# Patient Record
Sex: Female | Born: 1970
Health system: Southern US, Community
[De-identification: ages and names within clinical notes are randomized; demographics above are authoritative.]

## PROBLEM LIST (undated history)

## (undated) DIAGNOSIS — F988 Other specified behavioral and emotional disorders with onset usually occurring in childhood and adolescence: Secondary | ICD-10-CM

## (undated) DIAGNOSIS — Z8041 Family history of malignant neoplasm of ovary: Secondary | ICD-10-CM

## (undated) DIAGNOSIS — Z803 Family history of malignant neoplasm of breast: Secondary | ICD-10-CM

## (undated) DIAGNOSIS — N390 Urinary tract infection, site not specified: Secondary | ICD-10-CM

## (undated) DIAGNOSIS — S43213A Anterior subluxation of unspecified sternoclavicular joint, initial encounter: Secondary | ICD-10-CM

## (undated) DIAGNOSIS — J189 Pneumonia, unspecified organism: Secondary | ICD-10-CM

## (undated) DIAGNOSIS — N921 Excessive and frequent menstruation with irregular cycle: Secondary | ICD-10-CM

## (undated) DIAGNOSIS — Z87442 Personal history of urinary calculi: Secondary | ICD-10-CM

## (undated) DIAGNOSIS — F411 Generalized anxiety disorder: Secondary | ICD-10-CM

## (undated) DIAGNOSIS — Z1371 Encounter for nonprocreative screening for genetic disease carrier status: Secondary | ICD-10-CM

## (undated) HISTORY — DX: Other specified behavioral and emotional disorders with onset usually occurring in childhood and adolescence: F98.8

## (undated) HISTORY — DX: Family history of malignant neoplasm of breast: Z80.3

## (undated) HISTORY — DX: Urinary tract infection, site not specified: N39.0

## (undated) HISTORY — DX: Encounter for nonprocreative screening for genetic disease carrier status: Z13.71

## (undated) HISTORY — PX: TONSILLECTOMY: SUR1361

## (undated) HISTORY — PX: DILATION AND CURETTAGE OF UTERUS: SHX78

## (undated) HISTORY — DX: Excessive and frequent menstruation with irregular cycle: N92.1

## (undated) HISTORY — DX: Generalized anxiety disorder: F41.1

## (undated) HISTORY — DX: Family history of malignant neoplasm of ovary: Z80.41

## (undated) HISTORY — DX: Anterior subluxation of unspecified sternoclavicular joint, initial encounter: S43.213A

## (undated) HISTORY — PX: WISDOM TOOTH EXTRACTION: SHX21

---

## 2006-01-02 ENCOUNTER — Observation Stay: Payer: Self-pay

## 2006-05-28 ENCOUNTER — Emergency Department: Payer: Self-pay | Admitting: Emergency Medicine

## 2008-01-12 ENCOUNTER — Observation Stay: Payer: Self-pay | Admitting: Obstetrics and Gynecology

## 2008-01-20 ENCOUNTER — Inpatient Hospital Stay: Payer: Self-pay

## 2008-08-24 ENCOUNTER — Ambulatory Visit: Payer: Self-pay | Admitting: Genetic Counselor

## 2010-10-29 ENCOUNTER — Ambulatory Visit: Payer: Self-pay | Admitting: Unknown Physician Specialty

## 2011-08-08 ENCOUNTER — Encounter: Payer: Self-pay | Admitting: Obstetrics and Gynecology

## 2011-08-29 ENCOUNTER — Encounter: Payer: Self-pay | Admitting: Obstetrics and Gynecology

## 2011-09-02 ENCOUNTER — Encounter: Payer: Self-pay | Admitting: Maternal & Fetal Medicine

## 2011-09-10 ENCOUNTER — Observation Stay: Payer: Self-pay | Admitting: Obstetrics & Gynecology

## 2011-09-23 ENCOUNTER — Inpatient Hospital Stay: Payer: Self-pay

## 2011-09-23 LAB — CBC WITH DIFFERENTIAL/PLATELET
Basophil #: 0 10*3/uL (ref 0.0–0.1)
Basophil %: 0.2 %
Eosinophil #: 0.1 10*3/uL (ref 0.0–0.7)
HGB: 11.6 g/dL — ABNORMAL LOW (ref 12.0–16.0)
Lymphocyte #: 1.9 10*3/uL (ref 1.0–3.6)
Lymphocyte %: 15.3 %
Monocyte #: 0.6 x10 3/mm (ref 0.2–0.9)
Monocyte %: 5 %
Neutrophil #: 10 10*3/uL — ABNORMAL HIGH (ref 1.4–6.5)
RBC: 4.52 10*6/uL (ref 3.80–5.20)
RDW: 17.9 % — ABNORMAL HIGH (ref 11.5–14.5)
WBC: 12.6 10*3/uL — ABNORMAL HIGH (ref 3.6–11.0)

## 2011-09-26 ENCOUNTER — Observation Stay: Payer: Self-pay

## 2011-10-03 ENCOUNTER — Inpatient Hospital Stay: Payer: Self-pay

## 2011-10-03 LAB — PIH PROFILE
Calcium, Total: 8.6 mg/dL (ref 8.5–10.1)
Co2: 19 mmol/L — ABNORMAL LOW (ref 21–32)
Creatinine: 0.56 mg/dL — ABNORMAL LOW (ref 0.60–1.30)
EGFR (African American): >60
Glucose: 76 mg/dL (ref 65–99)
HGB: 11 g/dL — ABNORMAL LOW (ref 12.0–16.0)
MCH: 25.6 pg — ABNORMAL LOW (ref 26.0–34.0)
MCHC: 32.3 g/dL (ref 32.0–36.0)
Platelet: 147 10*3/uL — ABNORMAL LOW (ref 150–440)
Potassium: 4.1 mmol/L (ref 3.5–5.1)
Sodium: 138 mmol/L (ref 136–145)
Uric Acid: 6.9 mg/dL — ABNORMAL HIGH (ref 2.6–6.0)

## 2011-10-03 LAB — PROTEIN / CREATININE RATIO, URINE
Protein, Random Urine: 7 mg/dL (ref 0–12)
Protein/Creat. Ratio: 83 mg/gCREAT (ref 0–200)

## 2011-10-05 LAB — HEMATOCRIT: HCT: 31 % — ABNORMAL LOW (ref 35.0–47.0)

## 2011-10-09 LAB — PATHOLOGY REPORT

## 2014-08-02 NOTE — H&P (Signed)
L&D Evaluation:  History Expanded:   HPI [age] yo [id number], [facility]  Pt presents with potential labor.    Blood Type A negative    Maternal HIV Negative    Maternal Syphilis Ab Nonreactive    Maternal Varicella Immune    Rubella Results immune    Patient's Medical History No Chronic Illness    Patient's Surgical History none    Medications Pre Natal Vitamins    Allergies NKDA    Social History none   Exam:   Vital Signs stable    General no apparent distress    Mental Status clear    Chest clear    Heart normal sinus rhythm    Abdomen gravid, non-tender    Estimated Fetal Weight Average for gestational age    Fetal Position Vtx/Vtx on US    Pelvic 2-3/75/-3    Mebranes Intact    FHT normal rate with no decels   Impression:   Impression early labor, PTL, DiDi Twins   Plan:   Comments I have had an exceeddingly long and casreful discussion on several times with this patient about the risks and benefits of each choice available to them.  I have advised her, in my humble opinion, that we should proceed with a CS for maximize the health of the babies.  They will consider and make a decision.  Pt has decided that she would like to attempt a vaginal delivery, she understands my concerns and is willing to take the risk.  She also understands taht a CS is still possible during labor or delivery.  Pt has been fully informed about all options available, the risks and benefit of each.  She understands that a CS is major surgery with all the risks of surgery including but not limited to the risks of anesthesia, hemorrhage, infection, injury to surrounding structures - bowel, bladder, blood vessels.  She is willing to accept those risks and desires to proceed with abdominal delivery.   Electronic Signatures: Towana Badgerosenow, Philip J (MD)  (Signed 01-Jul-13 20:09)  Authored: L&D Evaluation   Last Updated: 01-Jul-13 20:09 by Towana Badgerosenow, Philip J (MD)

## 2014-08-02 NOTE — H&P (Signed)
L&D Evaluation:  History Expanded:   HPI 44 yo [id number] WF with TWINS and headache today.  Ate breakfast at 0730.  Headache began at 0930.  No work today.  No h/o headaches. See in office yesterday, BP borderline, Pregnancy Induced Hypertension panel done with normal results.   Today, BP in office was 140/90s.  Since here BPs have normalized.  Denies change in vision, epigastric pain, edema. [facility].    Presents with Headache    Patient's Medical History No Chronic Illness    Patient's Surgical History none    Medications Pre Natal Vitamins    Allergies NKDA    Social History none    Family History Non-Contributory   ROS:   ROS All systems were reviewed.  HEENT, CNS, GI, GU, Respiratory, CV, Renal and Musculoskeletal systems were found to be normal.   Exam:   Vital Signs stable  109/70    Urine Protein negative dipstick    General no apparent distress    Mental Status clear    Chest clear    Heart normal sinus rhythm    Abdomen gravid, non-tender    Estimated Fetal Weight Average for gestational age    Back no CVAT    Edema no edema    Reflexes 1+    FHT normal rate with no decels, for each twin, Non-Stress Test Reactive x2    Ucx absent    Skin dry   Impression:   Impression Headache.  No preclampsia (normal labs yesterday, normal BPs with rest).   Plan:   Plan EFM/NST    Comments Fluids, Rest, Tylenol, Diet.   Electronic Signatures: Letitia LibraHarris, Nitika Jackowski Paul (MD)  (Signed 18-Jun-13 14:30)  Authored: L&D Evaluation   Last Updated: 18-Jun-13 14:30 by Letitia LibraHarris, Salmaan Patchin Paul (MD)

## 2015-08-08 LAB — HM PAP SMEAR

## 2015-10-24 DIAGNOSIS — Z1371 Encounter for nonprocreative screening for genetic disease carrier status: Secondary | ICD-10-CM

## 2015-10-24 HISTORY — DX: Encounter for nonprocreative screening for genetic disease carrier status: Z13.71

## 2016-07-05 DIAGNOSIS — M5386 Other specified dorsopathies, lumbar region: Secondary | ICD-10-CM | POA: Diagnosis not present

## 2016-07-05 DIAGNOSIS — M9908 Segmental and somatic dysfunction of rib cage: Secondary | ICD-10-CM | POA: Diagnosis not present

## 2016-07-05 DIAGNOSIS — M9902 Segmental and somatic dysfunction of thoracic region: Secondary | ICD-10-CM | POA: Diagnosis not present

## 2016-07-05 DIAGNOSIS — M531 Cervicobrachial syndrome: Secondary | ICD-10-CM | POA: Diagnosis not present

## 2017-02-11 DIAGNOSIS — M9901 Segmental and somatic dysfunction of cervical region: Secondary | ICD-10-CM | POA: Diagnosis not present

## 2017-02-11 DIAGNOSIS — M5387 Other specified dorsopathies, lumbosacral region: Secondary | ICD-10-CM | POA: Diagnosis not present

## 2017-02-11 DIAGNOSIS — M531 Cervicobrachial syndrome: Secondary | ICD-10-CM | POA: Diagnosis not present

## 2017-02-11 DIAGNOSIS — M9902 Segmental and somatic dysfunction of thoracic region: Secondary | ICD-10-CM | POA: Diagnosis not present

## 2017-06-02 ENCOUNTER — Encounter: Payer: Self-pay | Admitting: Genetics

## 2017-06-30 DIAGNOSIS — M9902 Segmental and somatic dysfunction of thoracic region: Secondary | ICD-10-CM | POA: Diagnosis not present

## 2017-06-30 DIAGNOSIS — M5386 Other specified dorsopathies, lumbar region: Secondary | ICD-10-CM | POA: Diagnosis not present

## 2017-06-30 DIAGNOSIS — M531 Cervicobrachial syndrome: Secondary | ICD-10-CM | POA: Diagnosis not present

## 2017-06-30 DIAGNOSIS — M9903 Segmental and somatic dysfunction of lumbar region: Secondary | ICD-10-CM | POA: Diagnosis not present

## 2018-02-16 DIAGNOSIS — M7611 Psoas tendinitis, right hip: Secondary | ICD-10-CM | POA: Diagnosis not present

## 2018-02-16 DIAGNOSIS — M5386 Other specified dorsopathies, lumbar region: Secondary | ICD-10-CM | POA: Diagnosis not present

## 2018-02-16 DIAGNOSIS — M9902 Segmental and somatic dysfunction of thoracic region: Secondary | ICD-10-CM | POA: Diagnosis not present

## 2018-02-16 DIAGNOSIS — M531 Cervicobrachial syndrome: Secondary | ICD-10-CM | POA: Diagnosis not present

## 2018-03-09 DIAGNOSIS — M9902 Segmental and somatic dysfunction of thoracic region: Secondary | ICD-10-CM | POA: Diagnosis not present

## 2018-03-09 DIAGNOSIS — M9901 Segmental and somatic dysfunction of cervical region: Secondary | ICD-10-CM | POA: Diagnosis not present

## 2018-03-09 DIAGNOSIS — M531 Cervicobrachial syndrome: Secondary | ICD-10-CM | POA: Diagnosis not present

## 2018-03-31 ENCOUNTER — Encounter: Payer: Self-pay | Admitting: Obstetrics and Gynecology

## 2018-03-31 NOTE — Patient Instructions (Signed)
I value your feedback and entrusting us with your care. If you get a [facility] patient survey, I would appreciate you taking the time to let us know about your experience today. Thank you! 

## 2018-03-31 NOTE — Progress Notes (Signed)
PCP:  Patient, No Pcp Per   Chief Complaint  Patient presents with  . Gynecologic Exam    longer than normal periods lasting up to 2 weeks x last 2 yrs     HPI:      Erica Haynes is a 48 y.o. No obstetric history on file. who LMP was Patient's last menstrual period was 03/04/2018 (approximate)., presents today for her annual examination.  Her menses are regular every 28-30 days, lasting 12-14 days for the past 2 yrs. Flow is heavy for 3 days, changing super tampon Q 1-2 hrs. Bleeding stops after 6 days total, has a day off, and then a gush occurs again, followed by 3-4 days of light spotting.  Dysmenorrhea moderate. Pt used to have light flow with OCPs in past.   Sex activity: single partner, contraception - vasectomy.  Last Pap: May 05, 2013  Results were: no abnormalities /neg HPV DNA  Hx of STDs: none  Last mammogram: age 72, not recent.  There is a FH of breast cancer in her mat aunt. There is a FH of ovarian cancer in her mom and mat aunt, as well as PGM. Her mom is BRCA/"update testing" neg 2017. Pt is MyRisk neg 2017. IBIS=7.3%. The patient does do self-breast exams.  Tobacco use: The patient denies current or previous tobacco use. Alcohol use: social drinker No drug use.  Exercise: not active  She does not get adequate calcium and Vitamin D in her diet.  No recent fasting labs.   Past Medical History:  Diagnosis Date  . Attention deficit disorder   . BRCA negative 10/2015   MyRisk neg; IBIS=7.3%  . Family history of breast cancer   . Family history of ovarian cancer   . Generalized anxiety disorder   . UTI (urinary tract infection)     Past Surgical History:  Procedure Laterality Date  . DILATION AND CURETTAGE OF UTERUS     SAB  . TONSILLECTOMY      Family History  Problem Relation Age of Onset  . Ovarian cancer Mother 79       BRCA/MyRisk neg  . Hypertension Father   . Breast cancer Maternal Aunt        71s  . Melanoma Maternal Aunt    58s  . Ovarian cancer Maternal Aunt        78s  . Cancer Other        27s, colon cancer  . Ovarian cancer Paternal Grandmother     Social History   Socioeconomic History  . Marital status: Single    Spouse name: Not on file  . Number of children: Not on file  . Years of education: Not on file  . Highest education level: Not on file  Occupational History  . Not on file  Social Needs  . Financial resource strain: Not on file  . Food insecurity:    Worry: Not on file    Inability: Not on file  . Transportation needs:    Medical: Not on file    Non-medical: Not on file  Tobacco Use  . Smoking status: Never Smoker  . Smokeless tobacco: Never Used  Substance and Sexual Activity  . Alcohol use: Yes    Comment: rarely  . Drug use: Never  . Sexual activity: Yes    Birth control/protection: Surgical    Comment: Vasectomy  Lifestyle  . Physical activity:    Days per week: Not on file    Minutes per  session: Not on file  . Stress: Not on file  Relationships  . Social connections:    Talks on phone: Not on file    Gets together: Not on file    Attends religious service: Not on file    Active member of club or organization: Not on file    Attends meetings of clubs or organizations: Not on file    Relationship status: Not on file  . Intimate partner violence:    Fear of current or ex partner: Not on file    Emotionally abused: Not on file    Physically abused: Not on file    Forced sexual activity: Not on file  Other Topics Concern  . Not on file  Social History Narrative  . Not on file    Outpatient Medications Prior to Visit  Medication Sig Dispense Refill  . amphetamine-dextroamphetamine (ADDERALL) 10 MG tablet Take by mouth.     No facility-administered medications prior to visit.       ROS:  Review of Systems  Constitutional: Positive for fatigue. Negative for fever and unexpected weight change.  Respiratory: Negative for cough, shortness of breath and  wheezing.   Cardiovascular: Negative for chest pain, palpitations and leg swelling.  Gastrointestinal: Negative for blood in stool, constipation, diarrhea, nausea and vomiting.  Endocrine: Negative for cold intolerance, heat intolerance and polyuria.  Genitourinary: Positive for menstrual problem. Negative for dyspareunia, dysuria, flank pain, frequency, genital sores, hematuria, pelvic pain, urgency, vaginal bleeding, vaginal discharge and vaginal pain.  Musculoskeletal: Negative for back pain, joint swelling and myalgias.  Skin: Negative for rash.  Neurological: Negative for dizziness, syncope, light-headedness, numbness and headaches.  Hematological: Negative for adenopathy.  Psychiatric/Behavioral: Positive for agitation. Negative for confusion, sleep disturbance and suicidal ideas. The patient is not nervous/anxious.    BREAST: No symptoms   Objective: BP 128/90   Pulse 83   Ht '5\' 6"'$  (1.676 m)   Wt 172 lb (78 kg)   LMP 03/04/2018 (Approximate)   BMI 27.76 kg/m    Physical Exam Constitutional:      Appearance: She is well-developed.  Genitourinary:     Vulva, vagina, cervix, uterus, right adnexa and left adnexa normal.     No vulval lesion or tenderness noted.     No vaginal discharge, erythema or tenderness.     No cervical polyp.     Uterus is not enlarged or tender.     No right or left adnexal mass present.     Right adnexa not tender.     Left adnexa not tender.  Neck:     Musculoskeletal: Normal range of motion.     Thyroid: No thyromegaly.  Cardiovascular:     Rate and Rhythm: Normal rate and regular rhythm.     Heart sounds: Normal heart sounds. No murmur.  Pulmonary:     Effort: Pulmonary effort is normal.     Breath sounds: Normal breath sounds.  Chest:     Breasts:        Right: No mass, nipple discharge, skin change or tenderness.        Left: No mass, nipple discharge, skin change or tenderness.  Abdominal:     Palpations: Abdomen is soft.      Tenderness: There is no abdominal tenderness. There is no guarding.  Musculoskeletal: Normal range of motion.  Neurological:     Mental Status: She is alert and oriented to person, place, and time.     Cranial Nerves: No  cranial nerve deficit.  Psychiatric:        Behavior: Behavior normal.  Vitals signs reviewed.     Assessment/Plan: Encounter for annual routine gynecological examination  Cervical cancer screening - Plan: Cytology - PAP  Screening for HPV (human papillomavirus) - Plan: Cytology - PAP  Screening for breast cancer - Pt to sched mammo - Plan: MM 3D SCREEN BREAST BILATERAL  Family history of ovarian cancer - Pt and her mom are MyRisk neg. Don't have great screening options.  Menorrhagia with irregular cycle - Check labs. If neg, check GYN u/s. Will call with results.  - Plan: TSH, Prolactin, CBC with Differential/Platelet  Blood tests for routine general physical examination - Will check fasting lipids at another time since not fasting today. - Plan: Comprehensive metabolic panel          GYN counsel breast self exam, mammography screening, adequate intake of calcium and vitamin D, diet and exercise     F/U  Return in about 1 year (around 04/02/2019).  Barrett Holthaus B. Johanny Segers, PA-C 04/01/2018 3:21 PM

## 2018-04-01 ENCOUNTER — Encounter: Payer: Self-pay | Admitting: Obstetrics and Gynecology

## 2018-04-01 ENCOUNTER — Other Ambulatory Visit (HOSPITAL_COMMUNITY)
Admission: RE | Admit: 2018-04-01 | Discharge: 2018-04-01 | Disposition: A | Discharge status: Home / Self Care | Payer: BLUE CROSS/BLUE SHIELD | Source: Ambulatory Visit | Attending: Obstetrics and Gynecology | Admitting: Obstetrics and Gynecology

## 2018-04-01 ENCOUNTER — Ambulatory Visit (INDEPENDENT_AMBULATORY_CARE_PROVIDER_SITE_OTHER): Payer: BLUE CROSS/BLUE SHIELD | Admitting: Obstetrics and Gynecology

## 2018-04-01 VITALS — BP 128/90 | HR 83 | Ht 66.0 in | Wt 172.0 lb

## 2018-04-01 DIAGNOSIS — Z01419 Encounter for gynecological examination (general) (routine) without abnormal findings: Secondary | ICD-10-CM

## 2018-04-01 DIAGNOSIS — Z124 Encounter for screening for malignant neoplasm of cervix: Secondary | ICD-10-CM | POA: Diagnosis not present

## 2018-04-01 DIAGNOSIS — N921 Excessive and frequent menstruation with irregular cycle: Secondary | ICD-10-CM | POA: Diagnosis not present

## 2018-04-01 DIAGNOSIS — Z1151 Encounter for screening for human papillomavirus (HPV): Secondary | ICD-10-CM | POA: Insufficient documentation

## 2018-04-01 DIAGNOSIS — Z0000 Encounter for general adult medical examination without abnormal findings: Secondary | ICD-10-CM

## 2018-04-01 DIAGNOSIS — Z8041 Family history of malignant neoplasm of ovary: Secondary | ICD-10-CM

## 2018-04-01 DIAGNOSIS — Z1239 Encounter for other screening for malignant neoplasm of breast: Secondary | ICD-10-CM

## 2018-04-02 ENCOUNTER — Telehealth: Payer: Self-pay | Admitting: Obstetrics and Gynecology

## 2018-04-02 DIAGNOSIS — N921 Excessive and frequent menstruation with irregular cycle: Secondary | ICD-10-CM

## 2018-04-02 LAB — CBC WITH DIFFERENTIAL/PLATELET
Basophils Absolute: 0.1 10*3/uL (ref 0.0–0.2)
Basos: 1 %
EOS (ABSOLUTE): 0.2 10*3/uL (ref 0.0–0.4)
Eos: 3 %
Hematocrit: 41.1 % (ref 34.0–46.6)
Hemoglobin: 13 g/dL (ref 11.1–15.9)
Immature Grans (Abs): 0 10*3/uL (ref 0.0–0.1)
Immature Granulocytes: 0 %
Lymphocytes Absolute: 1.4 10*3/uL (ref 0.7–3.1)
Lymphs: 23 %
MCH: 25.8 pg — ABNORMAL LOW (ref 26.6–33.0)
MCHC: 31.6 g/dL (ref 31.5–35.7)
MCV: 82 fL (ref 79–97)
Monocytes Absolute: 0.5 10*3/uL (ref 0.1–0.9)
Monocytes: 8 %
NEUTROS ABS: 3.9 10*3/uL (ref 1.4–7.0)
Neutrophils: 65 %
Platelets: 243 10*3/uL (ref 150–450)
RBC: 5.03 x10E6/uL (ref 3.77–5.28)
RDW: 13.3 % (ref 11.7–15.4)
WBC: 6.1 10*3/uL (ref 3.4–10.8)

## 2018-04-02 LAB — COMPREHENSIVE METABOLIC PANEL
ALT: 14 IU/L (ref 0–32)
AST: 18 IU/L (ref 0–40)
Albumin/Globulin Ratio: 2.1 (ref 1.2–2.2)
Albumin: 4.5 g/dL (ref 3.5–5.5)
Alkaline Phosphatase: 58 IU/L (ref 39–117)
BILIRUBIN TOTAL: 0.5 mg/dL (ref 0.0–1.2)
BUN/Creatinine Ratio: 19 (ref 9–23)
BUN: 15 mg/dL (ref 6–24)
CHLORIDE: 100 mmol/L (ref 96–106)
CO2: 22 mmol/L (ref 20–29)
Calcium: 9.2 mg/dL (ref 8.7–10.2)
Creatinine, Ser: 0.81 mg/dL (ref 0.57–1.00)
GFR calc Af Amer: 101 mL/min/{1.73_m2} (ref >59)
GFR calc non Af Amer: 88 mL/min/{1.73_m2} (ref >59)
Globulin, Total: 2.1 g/dL (ref 1.5–4.5)
Glucose: 73 mg/dL (ref 65–99)
Potassium: 4.5 mmol/L (ref 3.5–5.2)
Sodium: 139 mmol/L (ref 134–144)
Total Protein: 6.6 g/dL (ref 6.0–8.5)

## 2018-04-02 LAB — TSH: TSH: 1.66 u[IU]/mL (ref 0.450–4.500)

## 2018-04-02 LAB — PROLACTIN: PROLACTIN: 9.2 ng/mL (ref 4.8–23.3)

## 2018-04-02 NOTE — Telephone Encounter (Signed)
LM with normal labs. Pt needs to sched GYN u/s. Order placed. Will call with results.

## 2018-04-03 LAB — CYTOLOGY - PAP
Diagnosis: NEGATIVE
HPV: NOT DETECTED

## 2018-04-24 ENCOUNTER — Encounter: Payer: Self-pay | Admitting: Obstetrics and Gynecology

## 2018-04-24 ENCOUNTER — Ambulatory Visit
Admission: RE | Admit: 2018-04-24 | Discharge: 2018-04-24 | Disposition: A | Discharge status: Home / Self Care | Payer: BLUE CROSS/BLUE SHIELD | Source: Ambulatory Visit | Attending: Obstetrics and Gynecology | Admitting: Obstetrics and Gynecology

## 2018-04-24 ENCOUNTER — Ambulatory Visit (INDEPENDENT_AMBULATORY_CARE_PROVIDER_SITE_OTHER): Payer: BLUE CROSS/BLUE SHIELD

## 2018-04-24 DIAGNOSIS — N921 Excessive and frequent menstruation with irregular cycle: Secondary | ICD-10-CM

## 2018-04-24 DIAGNOSIS — Z1231 Encounter for screening mammogram for malignant neoplasm of breast: Secondary | ICD-10-CM | POA: Diagnosis not present

## 2018-04-24 DIAGNOSIS — N8301 Follicular cyst of right ovary: Secondary | ICD-10-CM

## 2018-04-24 DIAGNOSIS — Z1239 Encounter for other screening for malignant neoplasm of breast: Secondary | ICD-10-CM | POA: Diagnosis not present

## 2018-04-27 ENCOUNTER — Telehealth: Payer: Self-pay | Admitting: Obstetrics and Gynecology

## 2018-04-27 NOTE — Telephone Encounter (Signed)
LM with GYN results. Need to discuss next steps in DUB mgmt. Suggest EMB then hormones vs IUD vs ablation vs nothing if pt prefers.

## 2018-05-11 DIAGNOSIS — M9902 Segmental and somatic dysfunction of thoracic region: Secondary | ICD-10-CM | POA: Diagnosis not present

## 2018-05-11 DIAGNOSIS — M75102 Unspecified rotator cuff tear or rupture of left shoulder, not specified as traumatic: Secondary | ICD-10-CM | POA: Diagnosis not present

## 2018-05-11 DIAGNOSIS — M531 Cervicobrachial syndrome: Secondary | ICD-10-CM | POA: Diagnosis not present

## 2018-05-12 NOTE — Telephone Encounter (Signed)
Spoke with pt re: Gyn u/s results. Discussed OCPs/nuvaring vs ablation. Pt had IUD in past and didn't do well with it. Pros/cons discussed. Pt interested in trying low dose BC.  RTO with menses for EMB and BC conf.

## 2018-05-25 ENCOUNTER — Other Ambulatory Visit (HOSPITAL_COMMUNITY)
Admission: RE | Admit: 2018-05-25 | Discharge: 2018-05-25 | Disposition: A | Discharge status: Home / Self Care | Payer: BLUE CROSS/BLUE SHIELD | Source: Ambulatory Visit | Attending: Obstetrics and Gynecology | Admitting: Obstetrics and Gynecology

## 2018-05-25 ENCOUNTER — Encounter: Payer: Self-pay | Admitting: Obstetrics and Gynecology

## 2018-05-25 ENCOUNTER — Ambulatory Visit
Admission: RE | Admit: 2018-05-25 | Discharge: 2018-05-25 | Disposition: A | Discharge status: Home / Self Care | Payer: BLUE CROSS/BLUE SHIELD | Attending: Internal Medicine | Admitting: Internal Medicine

## 2018-05-25 ENCOUNTER — Other Ambulatory Visit: Payer: Self-pay | Admitting: Internal Medicine

## 2018-05-25 ENCOUNTER — Ambulatory Visit
Admission: RE | Admit: 2018-05-25 | Discharge: 2018-05-25 | Disposition: A | Discharge status: Home / Self Care | Payer: BLUE CROSS/BLUE SHIELD | Source: Ambulatory Visit | Attending: Internal Medicine | Admitting: Internal Medicine

## 2018-05-25 ENCOUNTER — Ambulatory Visit (INDEPENDENT_AMBULATORY_CARE_PROVIDER_SITE_OTHER): Payer: BLUE CROSS/BLUE SHIELD | Admitting: Obstetrics and Gynecology

## 2018-05-25 VITALS — BP 118/90 | HR 83 | Ht 66.0 in | Wt 176.0 lb

## 2018-05-25 DIAGNOSIS — R52 Pain, unspecified: Secondary | ICD-10-CM

## 2018-05-25 DIAGNOSIS — M898X1 Other specified disorders of bone, shoulder: Secondary | ICD-10-CM | POA: Insufficient documentation

## 2018-05-25 DIAGNOSIS — N921 Excessive and frequent menstruation with irregular cycle: Secondary | ICD-10-CM | POA: Insufficient documentation

## 2018-05-25 DIAGNOSIS — M25512 Pain in left shoulder: Secondary | ICD-10-CM | POA: Diagnosis not present

## 2018-05-25 DIAGNOSIS — S42002A Fracture of unspecified part of left clavicle, initial encounter for closed fracture: Secondary | ICD-10-CM | POA: Diagnosis not present

## 2018-05-25 DIAGNOSIS — Z30011 Encounter for initial prescription of contraceptive pills: Secondary | ICD-10-CM

## 2018-05-25 DIAGNOSIS — S43213A Anterior subluxation of unspecified sternoclavicular joint, initial encounter: Secondary | ICD-10-CM

## 2018-05-25 HISTORY — DX: Anterior subluxation of unspecified sternoclavicular joint, initial encounter: S43.213A

## 2018-05-25 MED ORDER — NORETHIN-ETH ESTRAD-FE BIPHAS 1 MG-10 MCG / 10 MCG PO TABS: 1.0000 | ORAL_TABLET | Freq: Every day | ORAL | 3 refills | Status: DC

## 2018-05-25 NOTE — Progress Notes (Signed)
Rusty Aus, MD   Chief Complaint  Patient presents with  . Endometrial Biopsy    HPI:      [name] is a 48 y.o. [id number] who LMP was Patient's last menstrual period was 05/25/2018 (exact date)., presents today for EMB for menorrhagia with irregular cycle/DUB. Pt had neg labs, GYN u/s WNL. EMB=9.3 mm. Discussed BC options for sx mgmt.  Pt interested in low dose OCPs, had good cycle control with them in past. Had IUD in past and didn't have good experience.   Past Medical History:  Diagnosis Date  . 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 Attention deficit disorder   . BRCA negative 10/2015   MyRisk neg; IBIS=7.3%  . Family history of breast cancer   . Family history of ovarian cancer   . Generalized anxiety disorder   . Menorrhagia with irregular cycle   . UTI (urinary tract infection)     Past Surgical History:  Procedure Laterality Date  . DILATION AND CURETTAGE OF UTERUS     SAB  . TONSILLECTOMY      Family History  Problem Relation Age of Onset  . Ovarian cancer Mother 26       BRCA/MyRisk neg  . Hypertension Father   . Breast cancer Maternal Aunt        76s  . Melanoma Maternal Aunt        56s  . Ovarian cancer Maternal Aunt        61s  . Cancer Other        25s, colon cancer  . Ovarian cancer Paternal Grandmother     Social History   Socioeconomic History  . Marital status: Married    Spouse name: Not on file  . Number of children: Not on file  . Years of education: Not on file  . Highest education level: Not on file  Occupational History  . Not on file  Social Needs  . Financial resource strain: Not on file  . Food insecurity:    Worry: Not on file    Inability: Not on file  . Transportation needs:    Medical: Not on file    Non-medical: Not on file  Tobacco Use  . Smoking status: Never Smoker  . Smokeless tobacco: Never Used  Substance and Sexual Activity  . Alcohol use: Yes    Comment: rarely  .  Drug use: Never  . Sexual activity: Yes    Birth control/protection: Surgical    Comment: Vasectomy  Lifestyle  . Physical activity:    Days per week: Not on file    Minutes per session: Not on file  . Stress: Not on file  Relationships  . Social connections:    Talks on phone: Not on file    Gets together: Not on file    Attends religious service: Not on file    Active member of club or organization: Not on file    Attends meetings of clubs or organizations: Not on file    Relationship status: Not on file  . Intimate partner violence:    Fear of current or ex partner: Not on file    Emotionally abused: Not on file    Physically abused: Not on file    Forced sexual activity: Not on file  Other Topics Concern  . Not on file  Social History Narrative  . Not on file    Outpatient Medications Prior to Visit  Medication Sig Dispense  Refill  . amphetamine-dextroamphetamine (ADDERALL) 10 MG tablet Take by mouth.     No facility-administered medications prior to visit.     ROS:  Review of Systems  Constitutional: Negative for fever.  Gastrointestinal: Negative for blood in stool, constipation, diarrhea, nausea and vomiting.  Genitourinary: Positive for menstrual problem. Negative for dyspareunia, dysuria, flank pain, frequency, hematuria, urgency, vaginal bleeding, vaginal discharge and vaginal pain.  Musculoskeletal: Negative for back pain.  Skin: Negative for rash.     OBJECTIVE:   Vitals:  BP 118/90   Pulse 83   Ht _0  (1.676 m)   Wt 176 lb (79.8 kg)   LMP 05/25/2018 (Exact Date)   BMI 28.41 kg/m    Physical Exam Vitals signs reviewed.  Constitutional:      Appearance: She is well-developed.  Neck:     Musculoskeletal: Normal range of motion.  Pulmonary:     Effort: Pulmonary effort is normal.  Genitourinary:    General: Normal vulva.     Vagina: Normal. No vaginal discharge.     Cervix: No friability or erythema.  Musculoskeletal: Normal range of  motion.  Neurological:     Mental Status: She is alert and oriented to person, place, and time.     Cranial Nerves: No cranial nerve deficit.  Psychiatric:        Behavior: Behavior normal.        Thought Content: Thought content normal.        Judgment: Judgment normal.    Endometrial Biopsy After discussion with the patient regarding her abnormal uterine bleeding I recommended that she proceed with an endometrial biopsy for further diagnosis. The risks, benefits, alternatives, and indications for an endometrial biopsy were discussed with the patient in detail. She understood the risks including infection, bleeding, cervical laceration and uterine perforation.  Verbal consent was obtained.   PROCEDURE NOTE:  Pipelle endometrial biopsy was performed using aseptic technique with iodine preparation.  The uterus was sounded to a length of 9.0 cm.  Adequate sampling was obtained with minimal blood loss.  The patient tolerated the procedure well.  Disposition will be pending pathology.   Assessment/Plan: Menorrhagia with irregular cycle - EMB today. Start OCPs. 1 sample Lo Loestrin/coupon card. Rx eRxd. F/u prn sx.  - Plan: Surgical pathology, Norethindrone-Ethinyl Estradiol-Fe Biphas (LO LOESTRIN FE) 1 MG-10 MCG / 10 MCG tablet  Encounter for initial prescription of contraceptive pills - Plan: Norethindrone-Ethinyl Estradiol-Fe Biphas (LO LOESTRIN FE) 1 MG-10 MCG / 10 MCG tablet    Meds ordered this encounter  Medications  . Norethindrone-Ethinyl Estradiol-Fe Biphas (LO LOESTRIN FE) 1 MG-10 MCG / 10 MCG tablet    Sig: Take 1 tablet by mouth daily.    Dispense:  84 tablet    Refill:  3    Order Specific Question:   Supervising Provider    Answer:   HARMANI, NETO [[id number]]      Return if symptoms worsen or fail to improve.   B. , PA-C 05/25/2018 4:20 PM

## 2018-05-25 NOTE — Patient Instructions (Signed)
I value your feedback and entrusting us with your care. If you get a [facility] patient survey, I would appreciate you taking the time to let us know about your experience today. Thank you! 

## 2018-08-26 ENCOUNTER — Other Ambulatory Visit: Payer: Self-pay | Admitting: Obstetrics and Gynecology

## 2018-08-26 MED ORDER — DROSPIRENONE-ETHINYL ESTRADIOL 3-0.02 MG PO TABS: 1.0000 | ORAL_TABLET | Freq: Every day | ORAL | 2 refills | Status: DC

## 2018-08-26 NOTE — Progress Notes (Signed)
OCP change from Lo Loestrin to yaz due to continued BTB with Lo Lo.

## 2018-12-04 DIAGNOSIS — M9902 Segmental and somatic dysfunction of thoracic region: Secondary | ICD-10-CM | POA: Diagnosis not present

## 2018-12-04 DIAGNOSIS — M530 Cervicocranial syndrome: Secondary | ICD-10-CM | POA: Diagnosis not present

## 2018-12-04 DIAGNOSIS — M531 Cervicobrachial syndrome: Secondary | ICD-10-CM | POA: Diagnosis not present

## 2018-12-04 DIAGNOSIS — G44219 Episodic tension-type headache, not intractable: Secondary | ICD-10-CM | POA: Diagnosis not present

## 2018-12-17 ENCOUNTER — Emergency Department
Admission: EM | Admit: 2018-12-17 | Discharge: 2018-12-17 | Disposition: A | Discharge status: Home / Self Care | Payer: 59 | Attending: Student | Admitting: Student

## 2018-12-17 ENCOUNTER — Encounter: Payer: Self-pay | Admitting: Emergency Medicine

## 2018-12-17 ENCOUNTER — Emergency Department: Payer: 59

## 2018-12-17 ENCOUNTER — Other Ambulatory Visit: Payer: Self-pay

## 2018-12-17 DIAGNOSIS — Z79899 Other long term (current) drug therapy: Secondary | ICD-10-CM | POA: Insufficient documentation

## 2018-12-17 DIAGNOSIS — R109 Unspecified abdominal pain: Secondary | ICD-10-CM | POA: Diagnosis not present

## 2018-12-17 DIAGNOSIS — R112 Nausea with vomiting, unspecified: Secondary | ICD-10-CM | POA: Diagnosis not present

## 2018-12-17 DIAGNOSIS — N200 Calculus of kidney: Secondary | ICD-10-CM | POA: Diagnosis not present

## 2018-12-17 DIAGNOSIS — R300 Dysuria: Secondary | ICD-10-CM | POA: Diagnosis not present

## 2018-12-17 DIAGNOSIS — R1110 Vomiting, unspecified: Secondary | ICD-10-CM | POA: Diagnosis not present

## 2018-12-17 LAB — URINALYSIS, COMPLETE (UACMP) WITH MICROSCOPIC
Bacteria, UA: NONE SEEN
Bilirubin Urine: NEGATIVE
Glucose, UA: NEGATIVE mg/dL
Ketones, ur: NEGATIVE mg/dL
Leukocytes,Ua: NEGATIVE
Nitrite: NEGATIVE
Protein, ur: NEGATIVE mg/dL
RBC / HPF: >50 RBC/hpf — ABNORMAL HIGH (ref 0–5)
Specific Gravity, Urine: 1.018 (ref 1.005–1.030)
pH: 5 (ref 5.0–8.0)

## 2018-12-17 LAB — COMPREHENSIVE METABOLIC PANEL
ALT: 46 U/L — ABNORMAL HIGH (ref 0–44)
AST: 36 U/L (ref 15–41)
Albumin: 4.2 g/dL (ref 3.5–5.0)
Alkaline Phosphatase: 53 U/L (ref 38–126)
Anion gap: 14 (ref 5–15)
BUN: 14 mg/dL (ref 6–20)
CO2: 22 mmol/L (ref 22–32)
Calcium: 9.2 mg/dL (ref 8.9–10.3)
Chloride: 101 mmol/L (ref 98–111)
Creatinine, Ser: 0.97 mg/dL (ref 0.44–1.00)
GFR calc Af Amer: >60 mL/min (ref >60)
GFR calc non Af Amer: >60 mL/min (ref >60)
Glucose, Bld: 131 mg/dL — ABNORMAL HIGH (ref 70–99)
Potassium: 4 mmol/L (ref 3.5–5.1)
Sodium: 137 mmol/L (ref 135–145)
Total Bilirubin: 0.6 mg/dL (ref 0.3–1.2)
Total Protein: 7.2 g/dL (ref 6.5–8.1)

## 2018-12-17 LAB — CBC
HCT: 40 % (ref 36.0–46.0)
Hemoglobin: 12.9 g/dL (ref 12.0–15.0)
MCH: 26.7 pg (ref 26.0–34.0)
MCHC: 32.3 g/dL (ref 30.0–36.0)
MCV: 82.6 fL (ref 80.0–100.0)
Platelets: 226 10*3/uL (ref 150–400)
RBC: 4.84 MIL/uL (ref 3.87–5.11)
RDW: 14.3 % (ref 11.5–15.5)
WBC: 11.3 10*3/uL — ABNORMAL HIGH (ref 4.0–10.5)
nRBC: 0 % (ref 0.0–0.2)

## 2018-12-17 LAB — POCT PREGNANCY, URINE: Preg Test, Ur: NEGATIVE

## 2018-12-17 LAB — LIPASE, BLOOD: Lipase: 36 U/L (ref 11–51)

## 2018-12-17 MED ORDER — ONDANSETRON 4 MG PO TBDP
4.0000 mg | ORAL_TABLET | Freq: Once | ORAL | Status: AC | PRN
  Administered 2018-12-17: 12:00:00 4 mg via ORAL
  Filled 2018-12-17: qty 1

## 2018-12-17 MED ORDER — KETOROLAC TROMETHAMINE 30 MG/ML IJ SOLN
15.0000 mg | Freq: Once | INTRAMUSCULAR | Status: AC
  Administered 2018-12-17: 15:00:00 15 mg via INTRAVENOUS
  Filled 2018-12-17: qty 1

## 2018-12-17 MED ORDER — ONDANSETRON HCL 4 MG PO TABS: 4.0000 mg | ORAL_TABLET | Freq: Three times a day (TID) | ORAL | 0 refills | Status: AC | PRN

## 2018-12-17 MED ORDER — SODIUM CHLORIDE 0.9 % IV BOLUS
1000.0000 mL | Freq: Once | INTRAVENOUS | Status: AC
  Administered 2018-12-17: 1000 mL via INTRAVENOUS

## 2018-12-17 MED ORDER — MORPHINE SULFATE (PF) 4 MG/ML IV SOLN
4.0000 mg | Freq: Once | INTRAVENOUS | Status: AC
  Administered 2018-12-17: 14:00:00 4 mg via INTRAVENOUS
  Filled 2018-12-17: qty 1

## 2018-12-17 MED ORDER — SODIUM CHLORIDE 0.9% FLUSH: 3.0000 mL | Freq: Once | INTRAVENOUS | Status: DC

## 2018-12-17 MED ORDER — OXYCODONE HCL 5 MG PO TABS: 5.0000 mg | ORAL_TABLET | Freq: Four times a day (QID) | ORAL | 0 refills | Status: AC | PRN

## 2018-12-17 MED ORDER — IOHEXOL 300 MG/ML  SOLN
100.0000 mL | Freq: Once | INTRAMUSCULAR | Status: AC | PRN
  Administered 2018-12-17: 14:00:00 100 mL via INTRAVENOUS

## 2018-12-17 NOTE — ED Notes (Addendum)
Pt c/o left sided abdominal pain that started several days ago, pt states it feels like she had a UTI- urine smelled also. This morning NVD. Denies fevers/ chills. Pain radiates to back on left side.  At this time pt is restless in the bed.  Pt unable to keep anything down since yesterday.

## 2018-12-17 NOTE — ED Notes (Signed)
Pt transported to CT at this time.

## 2018-12-17 NOTE — ED Triage Notes (Signed)
Pt via pov from home with abdominal pain since last night and nausea/vomiting this morning. Pt points to left flank and bladder area to show the pain. She reports that she has felt like she has had a uti and has been taking "leftover" medication. Pt alert & oriented with NAD noted.

## 2018-12-17 NOTE — Discharge Instructions (Signed)
Thank you for letting us take care of you in the emergency department today.   Please continue to take any regular, prescribed medications.   New medications we have prescribed:  - oxycodone - as needed for pain - Zofran - as needed for nausea  Please follow up with: - Your urologist at your appointment tomorrow - Your primary care doctor to review your ER visit and follow up on your symptoms.   Please return to the ER for any new or worsening symptoms.

## 2018-12-17 NOTE — ED Provider Notes (Signed)
[facility] Emergency Department Provider Note  ____________________________________________   First MD Initiated Contact with Patient 12/17/18 1319     (approximate)  I have reviewed the triage vital signs and the nursing notes.  History  Chief Complaint Abdominal Pain and Nausea    HPI Erica Haynes is a 48 y.o. female with a history of UTI, who presents to the emergency department for abdominal/flank pain, nausea, vomiting, urinary symptoms.  Patient reports dysuria and malodorous urine, which started yesterday.  Overnight she developed left flank and left lower abdominal pain, associated with nausea and vomiting.  She also had one episode of loose stool.  Pain comes in waves, and is severe when presents. Seems to originate in the flank and radiate to the lower abdomen.  Sharp.  She denies any associated fevers. She denies any history of nephrolithiasis or diverticulitis.    Past Medical Hx Past Medical History:  Diagnosis Date  . 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 Attention deficit disorder   . BRCA negative 10/2015   MyRisk neg; IBIS=7.3%  . Family history of breast cancer   . Family history of ovarian cancer   . Generalized anxiety disorder   . Menorrhagia with irregular cycle   . UTI (urinary tract infection)     Problem List There are no active problems to display for this patient.   Past Surgical Hx Past Surgical History:  Procedure Laterality Date  . DILATION AND CURETTAGE OF UTERUS     SAB  . TONSILLECTOMY      Medications Prior to Admission medications   Medication Sig Start Date End Date Taking? Authorizing Provider  amphetamine-dextroamphetamine (ADDERALL) 10 MG tablet Take by mouth. 10/18/10   [provider]  drospirenone-ethinyl estradiol (YAZ) 3-0.02 MG tablet Take 1 tablet by mouth daily. 05/28/60   Copland, Deirdre Evener, PA-C    Allergies Patient has no known allergies.  Family Hx Family  History  Problem Relation Age of Onset  . Ovarian cancer Mother 85       BRCA/MyRisk neg  . Hypertension Father   . Breast cancer Maternal Aunt        54s  . Melanoma Maternal Aunt        23s  . Ovarian cancer Maternal Aunt        45s  . Cancer Other        16s, colon cancer  . Ovarian cancer Paternal Grandmother     Social Hx Social History   Tobacco Use  . Smoking status: Never Smoker  . Smokeless tobacco: Never Used  Substance Use Topics  . Alcohol use: Yes    Comment: rarely  . Drug use: Never     Review of Systems  Constitutional: Negative for fever, chills. Eyes: Negative for visual changes. ENT: Negative for sore throat. Cardiovascular: Negative for chest pain. Respiratory: Negative for shortness of breath. Gastrointestinal: + for nausea, vomiting, abdominal pain.  Genitourinary: + for dysuria. Musculoskeletal: Negative for leg swelling. Skin: Negative for rash. Neurological: Negative for for headaches.   Physical Exam  Vital Signs: ED Triage Vitals [12/17/18 1214]  Enc Vitals Group     BP (!) 152/72     Pulse Rate 69     Resp 18     Temp 97.6 F (36.4 C)     Temp Source Oral     SpO2 100 %     Weight 170 lb (77.1 kg)     Height 5' 6" (1.676 m)  Head Circumference      Peak Flow      Pain Score 7     Pain Loc      Pain Edu?      Excl. in [place]?     Constitutional: Alert and oriented. Appears somewhat uncomfortable.  Head: Normocephalic. Atraumatic. Eyes: Conjunctivae clear. Sclera anicteric. Nose: No congestion. No rhinorrhea. Mouth/Throat: Mucous membranes are moist.  Neck: No stridor.   Cardiovascular: Normal rate, regular rhythm. No murmurs. Extremities well perfused. Respiratory: Normal respiratory effort.  Lungs CTAB. Gastrointestinal: Soft. Left flank and LLQ TTP. No rebound or guarding. Remainder of abdomen is soft and non-tender.  Musculoskeletal: No lower extremity edema. No deformities. Neurologic:  Normal speech and  language. No gross focal neurologic deficits are appreciated.  Skin: Skin is warm, dry and intact. No rash noted. Psychiatric: Mood and affect are appropriate for situation.  EKG  N/A    Radiology  CT: IMPRESSION: Moderate left hydronephrosis due to a 0.4 cm stone at the left UVJ. Punctate nonobstructing stone right kidney. Small fat containing umbilical hernia. Lower lumbar degenerative disease.      Procedures  Procedure(s) performed (including critical care):  Procedures   Initial Impression / Assessment and Plan / ED Course  48 y.o. female who presents to the ED for renal colic type left-sided flank and abdominal pain, associated with nausea/vomiting and dysuria, as above.  Ddx: UTI, pyelonephritis, stone, diverticulitis  Plan: labs, urine, imaging, symptom control  Imaging reveals a left-sided 0.4 cm UVJ stone.  Urinalysis without evidence of concomitant infection.  Patient is feeling improved.  She states she already has an appointment with a urologist tomorrow, as her husband had a prior history of kidney stones and made her an appointment when she was experiencing symptoms this morning.  As such, will plan for discharge, will provide prescriptions for pain and nausea control, and advised adherence with her urology outpatient follow-up tomorrow.  Patient voices understanding and is comfortable to plan and discharge.  Given return precautions.   Final Clinical Impression(s) / ED Diagnosis  Final diagnoses:  Dysuria  Nausea and vomiting in adult  Left sided abdominal pain       Note:  This document was prepared using Dragon voice recognition software and may include unintentional dictation errors.   Lilia Pro., MD 12/17/18 [phone]

## 2018-12-18 DIAGNOSIS — N23 Unspecified renal colic: Secondary | ICD-10-CM | POA: Diagnosis not present

## 2018-12-18 DIAGNOSIS — N201 Calculus of ureter: Secondary | ICD-10-CM | POA: Diagnosis not present

## 2019-03-31 DIAGNOSIS — M4014 Other secondary kyphosis, thoracic region: Secondary | ICD-10-CM | POA: Diagnosis not present

## 2019-03-31 DIAGNOSIS — M9902 Segmental and somatic dysfunction of thoracic region: Secondary | ICD-10-CM | POA: Diagnosis not present

## 2019-03-31 DIAGNOSIS — M9901 Segmental and somatic dysfunction of cervical region: Secondary | ICD-10-CM | POA: Diagnosis not present

## 2019-03-31 DIAGNOSIS — M531 Cervicobrachial syndrome: Secondary | ICD-10-CM | POA: Diagnosis not present

## 2019-05-02 ENCOUNTER — Other Ambulatory Visit: Payer: Self-pay | Admitting: Obstetrics and Gynecology

## 2019-05-13 ENCOUNTER — Encounter: Payer: Self-pay | Admitting: Obstetrics and Gynecology

## 2019-08-03 ENCOUNTER — Other Ambulatory Visit: Payer: Self-pay

## 2019-08-03 ENCOUNTER — Encounter: Payer: Self-pay | Admitting: Obstetrics & Gynecology

## 2019-08-03 ENCOUNTER — Ambulatory Visit (INDEPENDENT_AMBULATORY_CARE_PROVIDER_SITE_OTHER): Payer: 59 | Admitting: Obstetrics & Gynecology

## 2019-08-03 VITALS — BP 120/80 | Ht 66.0 in | Wt 179.0 lb

## 2019-08-03 DIAGNOSIS — Z8041 Family history of malignant neoplasm of ovary: Secondary | ICD-10-CM | POA: Diagnosis not present

## 2019-08-03 DIAGNOSIS — N920 Excessive and frequent menstruation with regular cycle: Secondary | ICD-10-CM | POA: Diagnosis not present

## 2019-08-03 DIAGNOSIS — N946 Dysmenorrhea, unspecified: Secondary | ICD-10-CM | POA: Diagnosis not present

## 2019-08-03 DIAGNOSIS — N921 Excessive and frequent menstruation with irregular cycle: Secondary | ICD-10-CM | POA: Insufficient documentation

## 2019-08-03 NOTE — Patient Instructions (Signed)
Endometrial Ablation  Endometrial ablation is a procedure that destroys the thin inner layer of the lining of the uterus (endometrium). This procedure may be done:  To stop heavy periods.  To stop bleeding that is causing anemia.  To control irregular bleeding.  To treat bleeding caused by small tumors (fibroids) in the endometrium.  This procedure is often an alternative to major surgery, such as removal of the uterus and cervix (hysterectomy). As a result of this procedure:  You may not be able to have children. However, if you are premenopausal (you have not gone through menopause):  You may still have a small chance of getting pregnant.  You will need to use a reliable method of birth control after the procedure to prevent pregnancy.  You may stop having a menstrual period, or you may have only a small amount of bleeding during your period. Menstruation may return several years after the procedure.  Tell a health care provider about:  Any allergies you have.  All medicines you are taking, including vitamins, herbs, eye drops, creams, and over-the-counter medicines.  Any problems you or family members have had with the use of anesthetic medicines.  Any blood disorders you have.  Any surgeries you have had.  Any medical conditions you have.  What are the risks?  Generally, this is a safe procedure. However, problems may occur, including:  A hole (perforation) in the uterus or bowel.  Infection of the uterus, bladder, or vagina.  Bleeding.  Damage to other structures or organs.  An air bubble in the lung (air embolus).  Problems with pregnancy after the procedure.  Failure of the procedure.  Decreased ability to diagnose cancer in the endometrium.  What happens before the procedure?  You will have tests of your endometrium to make sure there are no pre-cancerous cells or cancer cells present.  You may have an ultrasound of the uterus.  You may be given medicines to thin the endometrium.  Ask your health care  provider about:  Changing or stopping your regular medicines. This is especially important if you take diabetes medicines or blood thinners.  Taking medicines such as aspirin and ibuprofen. These medicines can thin your blood. Do not take these medicines before your procedure if your doctor tells you not to.  Plan to have someone take you home from the hospital or clinic.  What happens during the procedure?    You will lie on an exam table with your feet and legs supported as in a pelvic exam.  To lower your risk of infection:  Your health care team will wash or sanitize their hands and put on germ-free (sterile) gloves.  Your genital area will be washed with soap.  An IV tube will be inserted into one of your veins.  You will be given a medicine to help you relax (sedative).  A surgical instrument with a light and camera (resectoscope) will be inserted into your vagina and moved into your uterus. This allows your surgeon to see inside your uterus.  Endometrial tissue will be removed using one of the following methods:  Radiofrequency. This method uses a radiofrequency-alternating electric current to remove the endometrium.  Cryotherapy. This method uses extreme cold to freeze the endometrium.  Heated-free liquid. This method uses a heated saltwater (saline) solution to remove the endometrium.  Microwave. This method uses high-energy microwaves to heat up the endometrium and remove it.  Thermal balloon. This method involves inserting a catheter with a balloon tip into   the uterus. The balloon tip is filled with heated fluid to remove the endometrium.  The procedure may vary among health care providers and hospitals.  What happens after the procedure?  Your blood pressure, heart rate, breathing rate, and blood oxygen level will be monitored until the medicines you were given have worn off.  As tissue healing occurs, you may notice vaginal bleeding for 4-6 weeks after the procedure. You may also  experience:  Cramps.  Thin, watery vaginal discharge that is light pink or brown in color.  A need to urinate more frequently than usual.  Nausea.  Do not drive for 24 hours if you were given a sedative.  Do not have sex or insert anything into your vagina until your health care provider approves.  Summary  Endometrial ablation is done to treat the many causes of heavy menstrual bleeding.  The procedure may be done only after medications have been tried to control the bleeding.  Plan to have someone take you home from the hospital or clinic.  This information is not intended to replace advice given to you by your health care provider. Make sure you discuss any questions you have with your health care provider.  Document Revised: 08/26/2017 Document Reviewed: 03/28/2016  Elsevier Patient Education  2020 [employer]

## 2019-08-03 NOTE — Progress Notes (Signed)
HPI:      [name] is a 49 y.o. [id number] who LMP was Patient's last menstrual period was 07/13/2019., presents today for a problem visit.  She complains of menorrhagia that  began several years ago and its severity is described as severe.  She has regular periods every 28 days and they are associated with moderate menstrual cramping.  She has used the following for attempts at control: hospital pad and tampon.  Periods are 7 days heavy then another 1-2 days then lingering spotting.  FH- concern for Ovarian cancer, mom and aunt    BRCA neg herself  Previous evaluation: office visit on 2020. Prior Diagnosis: menorrhagia. EMB last year normal.  Last [place] years ago Previous Treatment: OCP, little help and very sensitive to timing of taking pills.  Prior IUD- side effects and pain.  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 Also,  has a past surgical history that includes Dilation and curettage of uterus and Tonsillectomy., family history includes Breast cancer in her maternal aunt; Cancer in an other family member; Hypertension in her father; Melanoma in her maternal aunt; Ovarian cancer in her maternal aunt and paternal grandmother; Ovarian cancer (age of onset: 65) in her mother.,  reports that she has never smoked. She has never used smokeless tobacco. She reports current alcohol use. She reports that she does not use drugs.  She  Current Outpatient Medications:  .  amphetamine-dextroamphetamine (ADDERALL) 10 MG tablet, Take by mouth., Disp: , Rfl:  .  drospirenone-ethinyl estradiol (YAZ) 3-0.02 MG tablet, Take 1 tablet by mouth daily. (Patient not taking: Reported on 08/03/2019), Disp: 3 Package, Rfl: 2  Also, has No  Known Allergies.  Review of Systems  Constitutional: Negative for chills, fever and malaise/fatigue.  HENT: Negative for congestion, sinus pain and sore throat.   Eyes: Negative for blurred vision and pain.  Respiratory: Negative for cough and wheezing.   Cardiovascular: Negative for chest pain and leg swelling.  Gastrointestinal: Negative for abdominal pain, constipation, diarrhea, heartburn, nausea and vomiting.  Genitourinary: Negative for dysuria, frequency, hematuria and urgency.  Musculoskeletal: Negative for back pain, joint pain, myalgias and neck pain.  Skin: Negative for itching and rash.  Neurological: Negative for dizziness, tremors and weakness.  Endo/Heme/Allergies: Does not bruise/bleed easily.  Psychiatric/Behavioral: Negative for depression. The patient is not nervous/anxious and does not have insomnia.     Objective: BP 120/80   Ht '5\' 6"'$  (1.676 m)   Wt 179 lb (81.2 kg)   LMP 07/13/2019   BMI 28.89 kg/m  Physical Exam Constitutional:      General: She is not in acute distress.    Appearance: She is well-developed.  Musculoskeletal:        General: Normal range of motion.  Neurological:     Mental Status: She is alert and oriented to person, place, and time.  Skin:    General: Skin is warm and dry.  Vitals reviewed.     ASSESSMENT/PLAN:  menorrhagia and painful periods  Problem List Items Addressed This Visit      Genitourinary   Dysmenorrhea     Other   Menorrhagia with regular cycle - Primary   Family history of ovarian cancer    Patient has abnormal uterine bleeding . She has a normal  exam today, with no evidence of lesions.  Evaluation includes the following: exam, labs such as hormonal testing, and pelvic ultrasound to evaluate for any structural gynecologic abnormalities.  Patient to follow up after testing.  Treatment option for menorrhagia or menometrorrhagia discussed in great detail with the patient.  Options include hormonal therapy, IUD  therapy such as Mirena, D&C, Ablation, and Hysterectomy.  The pros and cons of each option discussed with patient.  Patient was told that it is normal to have menstrual bleeding after an endometrial ablation, only about 80% of patients become amenorrheic, 10% of patients have normal or light periods, and 10% of patients have no change in their bleeding pattern and may need further intervention.  She was told she will observe her periods for a few months after her ablation to see what her periods will be like; it is recommended to wait until at least three months after the procedure before making conclusions about how periods are going to be like after an ablation.  [place] soon Consider ablation scheduling after next period  Barnett Applebaum, MD, [facility], [facility] 08/03/2019  2:55 PM

## 2019-08-11 ENCOUNTER — Other Ambulatory Visit: Payer: Self-pay | Admitting: Obstetrics & Gynecology

## 2019-08-11 ENCOUNTER — Other Ambulatory Visit: Payer: Self-pay

## 2019-08-11 ENCOUNTER — Ambulatory Visit (INDEPENDENT_AMBULATORY_CARE_PROVIDER_SITE_OTHER): Payer: 59

## 2019-08-11 DIAGNOSIS — N946 Dysmenorrhea, unspecified: Secondary | ICD-10-CM

## 2019-08-11 DIAGNOSIS — N920 Excessive and frequent menstruation with regular cycle: Secondary | ICD-10-CM

## 2019-08-11 DIAGNOSIS — Z8041 Family history of malignant neoplasm of ovary: Secondary | ICD-10-CM

## 2019-08-13 ENCOUNTER — Telehealth: Payer: Self-pay | Admitting: Obstetrics & Gynecology

## 2019-08-13 NOTE — Telephone Encounter (Signed)
Pt called to inform that she has not started cycle. I adv that she call me on the first day of next cycle in order to schedule procedure.  Pt asked about the difference between in office vs. Outpatient. I recommended she contact her insurance carrier to determine pt responsibility and deductible amounts.  She will call me on first day of next cycle.

## 2019-11-05 ENCOUNTER — Encounter: Payer: Self-pay | Admitting: Genetic Counselor

## 2019-11-23 NOTE — Telephone Encounter (Signed)
Called to see if pt is still interested in having the in office procedure. LM for pt to rtn call.

## 2020-01-18 ENCOUNTER — Other Ambulatory Visit: Payer: Self-pay

## 2020-01-18 ENCOUNTER — Ambulatory Visit (INDEPENDENT_AMBULATORY_CARE_PROVIDER_SITE_OTHER): Payer: Self-pay

## 2020-01-18 ENCOUNTER — Encounter: Payer: Self-pay | Admitting: Obstetrics and Gynecology

## 2020-01-18 DIAGNOSIS — R350 Frequency of micturition: Secondary | ICD-10-CM

## 2020-01-18 DIAGNOSIS — R3915 Urgency of urination: Secondary | ICD-10-CM

## 2020-01-18 DIAGNOSIS — R8290 Unspecified abnormal findings in urine: Secondary | ICD-10-CM

## 2020-01-18 LAB — POCT URINALYSIS DIPSTICK
Bilirubin, UA: NEGATIVE
Blood, UA: NEGATIVE
Glucose, UA: NEGATIVE
Ketones, UA: NEGATIVE
Nitrite, UA: NEGATIVE
Protein, UA: NEGATIVE
Spec Grav, UA: 1.01 (ref 1.010–1.025)
Urobilinogen, UA: NEGATIVE E.U./dL — AB
pH, UA: 6.5 (ref 5.0–8.0)

## 2020-01-18 NOTE — Telephone Encounter (Signed)
Add to nurse visit please

## 2020-01-18 NOTE — Progress Notes (Signed)
Pt came in to give a ccu specimen.  See u/a. Will send for culture. Pt aware.

## 2020-01-18 NOTE — Telephone Encounter (Signed)
Called and spoke with patient about scheduling an Ua drop nurse visit. Patient is schedule for today 01/18/20 at 10:00

## 2020-01-22 LAB — URINE CULTURE

## 2020-01-24 ENCOUNTER — Other Ambulatory Visit: Payer: Self-pay | Admitting: Obstetrics and Gynecology

## 2020-01-24 MED ORDER — NITROFURANTOIN MONOHYD MACRO 100 MG PO CAPS: 100.0000 mg | ORAL_CAPSULE | Freq: Two times a day (BID) | ORAL | 0 refills | Status: AC

## 2020-01-24 NOTE — Progress Notes (Signed)
Rx macrobid for UTI 

## 2020-01-24 NOTE — Progress Notes (Signed)
PCP:  Rusty Aus, MD   Chief Complaint  Patient presents with  . Gynecologic Exam    irreg cycles, hair loss, weight gain, mood swings     HPI:      [name] is a 49 y.o. No obstetric history on file. who LMP was Patient's last menstrual period was 01/06/2020 (exact date)., presents today for her annual examination.  Her menses are irregular now, Q1-3 months, lasting 7-14 days (for the past 3 yrs). Flow is heavy for 3 days, changing super tampon Q 1-2 hrs. Bleeding stops after 6 days total, has a day off, and then a gush occurs again, followed by 3-4 days of light spotting.  Dysmenorrhea moderate. Pt used to have light flow with OCPs in past. S/p neg EMB 2020, discussed ablation with Dr. Kenton Kingfisher 5/21, but not done since then didn't have a period for 3 months.  Pt very upset about hair thinning and coming out in clumps since 5/21. Takes MVI but doesn't have iron in it. Also waking in middle of night and not sleeping well. Has anxiety/depression sx, feels overwhelmed like something "has to give". Not exercising now because doesn't feel she has time. On lexapro in past with sx improvement. Hasn't taken for yrs. Also diagnosed with ADD in past and did adderall with some improvement. Question was if ADD contributing to stress of not getting things done.   Sex activity: single partner, contraception - vasectomy.  Last Pap: 04/01/18  Results were: no abnormalities /neg HPV DNA  Hx of STDs: none  Last mammogram: 04/24/18 Results were normal, repeat in 12 months There is a FH of breast cancer in her mat aunt. There is a FH of ovarian cancer in her mom and mat aunt, as well as PGM. Her mom is BRCA/"update testing" neg 2017. Pt is MyRisk neg 2017. IBIS=7.3%. The patient does do self-breast exams.  Tobacco use: The patient denies current or previous tobacco use. Alcohol use: social drinker No drug use.  Exercise: not active. Was exercising with wt loss and felt really good, but then  stopped.   She does get adequate calcium and Vitamin D in her diet.  Colonoscopy: never  No recent lipids, normal CMP 1/20  Treated for UTI yesterday. Picked up abx today.   Past Medical History:  Diagnosis Date  . 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 Attention deficit disorder   . BRCA negative 10/2015   MyRisk neg; IBIS=7.3%  . Family history of breast cancer   . Family history of ovarian cancer   . Generalized anxiety disorder   . Menorrhagia with irregular cycle   . UTI (urinary tract infection)     Past Surgical History:  Procedure Laterality Date  . DILATION AND CURETTAGE OF UTERUS     SAB  . TONSILLECTOMY      Family History  Problem Relation Age of Onset  . Ovarian cancer Mother 64       BRCA/MyRisk neg  . Hypertension Father   . Breast cancer Maternal Aunt        24s  . Melanoma Maternal Aunt        34s  . Ovarian cancer Maternal Aunt        18s  . Cancer Other        17s, colon cancer  . Ovarian cancer Paternal Grandmother     Social History   Socioeconomic History  . Marital status: Married    Spouse name: Not on  file  . Number of children: Not on file  . Years of education: Not on file  . Highest education level: Not on file  Occupational History  . Not on file  Tobacco Use  . Smoking status: Never Smoker  . Smokeless tobacco: Never Used  Vaping Use  . Vaping Use: Never used  Substance and Sexual Activity  . Alcohol use: Yes    Comment: rarely  . Drug use: Never  . Sexual activity: Yes    Birth control/protection: Surgical    Comment: Vasectomy  Other Topics Concern  . Not on file  Social History Narrative  . Not on file   Social Determinants of Health   Financial Resource Strain:   . Difficulty of Paying Living Expenses: Not on file  Food Insecurity:   . Worried About Charity fundraiser in the Last Year: Not on file  . Ran Out of Food in the Last Year: Not on file  Transportation Needs:   . Lack of  Transportation (Medical): Not on file  . Lack of Transportation (Non-Medical): Not on file  Physical Activity:   . Days of Exercise per Week: Not on file  . Minutes of Exercise per Session: Not on file  Stress:   . Feeling of Stress : Not on file  Social Connections:   . Frequency of Communication with Friends and Family: Not on file  . Frequency of Social Gatherings with Friends and Family: Not on file  . Attends Religious Services: Not on file  . Active Member of Clubs or Organizations: Not on file  . Attends Archivist Meetings: Not on file  . Marital Status: Not on file  Intimate Partner Violence:   . Fear of Current or Ex-Partner: Not on file  . Emotionally Abused: Not on file  . Physically Abused: Not on file  . Sexually Abused: Not on file    Outpatient Medications Prior to Visit  Medication Sig Dispense Refill  . nitrofurantoin, macrocrystal-monohydrate, (MACROBID) 100 MG capsule Take 1 capsule (100 mg total) by mouth 2 (two) times daily for 5 days. 10 capsule 0  . amphetamine-dextroamphetamine (ADDERALL) 10 MG tablet Take by mouth.    . drospirenone-ethinyl estradiol (YAZ) 3-0.02 MG tablet Take 1 tablet by mouth daily. (Patient not taking: Reported on 08/03/2019) 3 Package 2   No facility-administered medications prior to visit.      ROS:  Review of Systems  Constitutional: Negative for fatigue, fever and unexpected weight change.  Respiratory: Negative for cough, shortness of breath and wheezing.   Cardiovascular: Negative for chest pain, palpitations and leg swelling.  Gastrointestinal: Negative for blood in stool, constipation, diarrhea, nausea and vomiting.  Endocrine: Negative for cold intolerance, heat intolerance and polyuria.  Genitourinary: Positive for menstrual problem. Negative for dyspareunia, dysuria, flank pain, frequency, genital sores, hematuria, pelvic pain, urgency, vaginal bleeding, vaginal discharge and vaginal pain.  Musculoskeletal:  Positive for arthralgias. Negative for back pain, joint swelling and myalgias.  Skin: Negative for rash.  Neurological: Negative for dizziness, syncope, light-headedness, numbness and headaches.  Hematological: Negative for adenopathy.  Psychiatric/Behavioral: Positive for agitation and dysphoric mood. Negative for confusion, sleep disturbance and suicidal ideas. The patient is not nervous/anxious.     Objective: BP 140/80   Ht $R'5\' 6"'se$  (1.676 m)   Wt 181 lb (82.1 kg)   LMP 01/06/2020 (Exact Date)   BMI 29.21 kg/m    Physical Exam Constitutional:      Appearance: She is well-developed.  Genitourinary:  Vulva, vagina, cervix, uterus, right adnexa and left adnexa normal.     No vulval lesion or tenderness noted.     No vaginal discharge, erythema or tenderness.     No cervical polyp.     Uterus is not enlarged or tender.     No right or left adnexal mass present.     Right adnexa not tender.     Left adnexa not tender.  Neck:     Thyroid: No thyromegaly.  Cardiovascular:     Rate and Rhythm: Normal rate and regular rhythm.     Heart sounds: Normal heart sounds. No murmur heard.   Pulmonary:     Effort: Pulmonary effort is normal.     Breath sounds: Normal breath sounds.  Chest:     Breasts:        Right: No mass, nipple discharge, skin change or tenderness.        Left: No mass, nipple discharge, skin change or tenderness.  Abdominal:     Palpations: Abdomen is soft.     Tenderness: There is no abdominal tenderness. There is no guarding.  Musculoskeletal:        General: Normal range of motion.     Cervical back: Normal range of motion.  Neurological:     General: No focal deficit present.     Mental Status: She is alert and oriented to person, place, and time.     Cranial Nerves: No cranial nerve deficit.  Skin:    General: Skin is warm and dry.  Psychiatric:        Mood and Affect: Mood normal.        Behavior: Behavior normal.        Thought Content: Thought  content normal.        Judgment: Judgment normal.  Vitals reviewed.    GAD 7 : Generalized Anxiety Score 01/25/2020  Nervous, Anxious, on Edge 2  Control/stop worrying 3  Worry too much - different things 3  Trouble relaxing 2  Restless 1  Easily annoyed or irritable 3  Afraid - awful might happen 1  Total GAD 7 Score 15  Anxiety Difficulty Very difficult    Depression screen PHQ 2/9 01/25/2020  Decreased Interest 3  Down, Depressed, Hopeless 3  PHQ - 2 Score 6  Altered sleeping 3  Tired, decreased energy 3  Change in appetite 3  Feeling bad or failure about yourself  3  Trouble concentrating 0  Moving slowly or fidgety/restless 0  Suicidal thoughts 0  PHQ-9 Score 18  Difficult doing work/chores Extremely dIfficult     Assessment/Plan: Encounter for annual routine gynecological examination  Irregular menses--most likely perimenopausal vs stress. F/u prn AUB. If becomes monthly again, pt to consider ablation again.   Encounter for screening mammogram for malignant neoplasm of breast - Plan: MM 3D SCREEN BREAST BILATERAL; pt to sched mammo  Family history of ovarian cancer--pt and her mom are MyRisk neg. Will cont to follow for updated testing for pt.  Screening for colon cancer - Plan: Ambulatory referral to Gastroenterology; refer to GI for scr colonoscopy due to age  Hair loss - Plan: TSH + free T4, Ferritin; check labs. If neg, most likely due to increased stress. Cont MVI, add Fe.  Thyroid disorder screening - Plan: TSH + free T4  Anxiety and depression - Plan: escitalopram (LEXAPRO) 10 MG tablet; Discussed restarting SSRI and pt amenable. Rx lexapro. RTO in 7 wks for f/u/sooner prn.   Meds  ordered this encounter  Medications  . escitalopram (LEXAPRO) 10 MG tablet    Sig: Take 1/2 tab daily for 6 days then 1 tab daily    Dispense:  30 tablet    Refill:  1    Order Specific Question:   Supervising Provider    Answer:   DAVIA, SMYRE [[id number]]            GYN counsel breast self exam, mammography screening, adequate intake of calcium and vitamin D, diet and exercise     F/U  Return in about 7 weeks (around 03/14/2020) for anxiety/depression f/u.  Vivion Romano B. Spyridon Hornstein, PA-C 01/25/2020 5:24 PM

## 2020-01-25 ENCOUNTER — Encounter: Payer: Self-pay | Admitting: Obstetrics and Gynecology

## 2020-01-25 ENCOUNTER — Ambulatory Visit (INDEPENDENT_AMBULATORY_CARE_PROVIDER_SITE_OTHER): Payer: 59 | Admitting: Obstetrics and Gynecology

## 2020-01-25 ENCOUNTER — Other Ambulatory Visit: Payer: Self-pay

## 2020-01-25 VITALS — BP 140/80 | Ht 66.0 in | Wt 181.0 lb

## 2020-01-25 DIAGNOSIS — Z1231 Encounter for screening mammogram for malignant neoplasm of breast: Secondary | ICD-10-CM | POA: Diagnosis not present

## 2020-01-25 DIAGNOSIS — Z8041 Family history of malignant neoplasm of ovary: Secondary | ICD-10-CM

## 2020-01-25 DIAGNOSIS — Z1211 Encounter for screening for malignant neoplasm of colon: Secondary | ICD-10-CM

## 2020-01-25 DIAGNOSIS — F32A Depression, unspecified: Secondary | ICD-10-CM

## 2020-01-25 DIAGNOSIS — F419 Anxiety disorder, unspecified: Secondary | ICD-10-CM

## 2020-01-25 DIAGNOSIS — N926 Irregular menstruation, unspecified: Secondary | ICD-10-CM

## 2020-01-25 DIAGNOSIS — Z01419 Encounter for gynecological examination (general) (routine) without abnormal findings: Secondary | ICD-10-CM | POA: Diagnosis not present

## 2020-01-25 DIAGNOSIS — L659 Nonscarring hair loss, unspecified: Secondary | ICD-10-CM | POA: Diagnosis not present

## 2020-01-25 DIAGNOSIS — Z1329 Encounter for screening for other suspected endocrine disorder: Secondary | ICD-10-CM | POA: Diagnosis not present

## 2020-01-25 DIAGNOSIS — R69 Illness, unspecified: Secondary | ICD-10-CM | POA: Diagnosis not present

## 2020-01-25 DIAGNOSIS — Z803 Family history of malignant neoplasm of breast: Secondary | ICD-10-CM

## 2020-01-25 MED ORDER — ESCITALOPRAM OXALATE 10 MG PO TABS: ORAL_TABLET | ORAL | 1 refills | Status: DC

## 2020-01-25 NOTE — Patient Instructions (Signed)
I value your feedback and entrusting us with your care. If you get a [facility] patient survey, I would appreciate you taking the time to let us know about your experience today. Thank you!  As of March 04, 2019, your lab results will be released to your MyChart immediately, before I even have a chance to see them. Please give me time to review them and contact you if there are any abnormalities. Thank you for your patience.   [facility]: [phone]  [facility]: [phone]  

## 2020-01-26 LAB — TSH+FREE T4
Free T4: 1.24 ng/dL (ref 0.82–1.77)
TSH: 1.9 u[IU]/mL (ref 0.450–4.500)

## 2020-01-26 LAB — FERRITIN: Ferritin: 5 ng/mL — ABNORMAL LOW (ref 15–150)

## 2020-02-04 ENCOUNTER — Other Ambulatory Visit: Payer: Self-pay

## 2020-02-04 ENCOUNTER — Telehealth (INDEPENDENT_AMBULATORY_CARE_PROVIDER_SITE_OTHER): Payer: Self-pay | Admitting: Gastroenterology

## 2020-02-04 DIAGNOSIS — Z8371 Family history of colonic polyps: Secondary | ICD-10-CM

## 2020-02-04 DIAGNOSIS — F988 Other specified behavioral and emotional disorders with onset usually occurring in childhood and adolescence: Secondary | ICD-10-CM | POA: Insufficient documentation

## 2020-02-04 DIAGNOSIS — Z1211 Encounter for screening for malignant neoplasm of colon: Secondary | ICD-10-CM

## 2020-02-04 DIAGNOSIS — M5030 Other cervical disc degeneration, unspecified cervical region: Secondary | ICD-10-CM | POA: Insufficient documentation

## 2020-02-04 MED ORDER — PEG 3350-KCL-NA BICARB-NACL 420 G PO SOLR: 4000.0000 mL | Freq: Once | ORAL | 0 refills | Status: AC

## 2020-02-04 NOTE — Progress Notes (Signed)
Gastroenterology Pre-Procedure Review  Request Date: Friday 03/03/20 Requesting Physician: Dr. Tobi Bastos  PATIENT REVIEW QUESTIONS: The patient responded to the following health history questions as indicated:    1. Are you having any GI issues? no 2. Do you have a personal history of Polyps? no 3. Do you have a family history of Colon Cancer or Polyps? yes (father colon polyps, 1st Cousin Colon Cancer) 4. Diabetes Mellitus? no 5. Joint replacements in the past 12 months?no 6. Major health problems in the past 3 months?no 7. Any artificial heart valves, MVP, or defibrillator?no    MEDICATIONS & ALLERGIES:    Patient reports the following regarding taking any anticoagulation/antiplatelet therapy:   Plavix, Coumadin, Eliquis, Xarelto, Lovenox, Pradaxa, Brilinta, or Effient? no Aspirin? no  Patient confirms/reports the following medications:  Current Outpatient Medications  Medication Sig Dispense Refill   escitalopram (LEXAPRO) 10 MG tablet Take 1/2 tab daily for 6 days then 1 tab daily 30 tablet 1   No current facility-administered medications for this visit.    Patient confirms/reports the following allergies:  No Known Allergies  No orders of the defined types were placed in this encounter.   AUTHORIZATION INFORMATION Primary Insurance: 1D#: Group #:  Secondary Insurance: 1D#: Group #:  SCHEDULE INFORMATION: Date: Friday 03/03/20 Time: Location:[facility]

## 2020-02-21 ENCOUNTER — Telehealth: Payer: Self-pay | Admitting: Gastroenterology

## 2020-02-29 ENCOUNTER — Other Ambulatory Visit: Payer: Self-pay | Admitting: Obstetrics and Gynecology

## 2020-02-29 DIAGNOSIS — F32A Depression, unspecified: Secondary | ICD-10-CM

## 2020-03-01 ENCOUNTER — Other Ambulatory Visit: Payer: Self-pay | Admitting: Obstetrics and Gynecology

## 2020-03-01 DIAGNOSIS — F32A Depression, unspecified: Secondary | ICD-10-CM

## 2020-03-03 ENCOUNTER — Ambulatory Visit: Admit: 2020-03-03 | Payer: 59 | Admitting: Gastroenterology

## 2020-03-03 SURGERY — COLONOSCOPY WITH PROPOFOL
Anesthesia: General

## 2020-03-07 ENCOUNTER — Ambulatory Visit
Admission: RE | Admit: 2020-03-07 | Discharge: 2020-03-07 | Disposition: A | Discharge status: Home / Self Care | Payer: 59 | Source: Ambulatory Visit | Attending: Obstetrics and Gynecology | Admitting: Obstetrics and Gynecology

## 2020-03-07 ENCOUNTER — Other Ambulatory Visit: Payer: Self-pay

## 2020-03-07 DIAGNOSIS — Z1231 Encounter for screening mammogram for malignant neoplasm of breast: Secondary | ICD-10-CM | POA: Diagnosis not present

## 2020-03-08 NOTE — Telephone Encounter (Signed)
open encounter in error 

## 2020-03-13 ENCOUNTER — Other Ambulatory Visit: Payer: Self-pay

## 2020-03-13 ENCOUNTER — Ambulatory Visit (INDEPENDENT_AMBULATORY_CARE_PROVIDER_SITE_OTHER): Payer: 59 | Admitting: Obstetrics and Gynecology

## 2020-03-13 ENCOUNTER — Encounter: Payer: Self-pay | Admitting: Obstetrics and Gynecology

## 2020-03-13 DIAGNOSIS — F32A Depression, unspecified: Secondary | ICD-10-CM

## 2020-03-13 DIAGNOSIS — F419 Anxiety disorder, unspecified: Secondary | ICD-10-CM | POA: Diagnosis not present

## 2020-03-13 DIAGNOSIS — R69 Illness, unspecified: Secondary | ICD-10-CM | POA: Diagnosis not present

## 2020-03-13 MED ORDER — ESCITALOPRAM OXALATE 10 MG PO TABS: ORAL_TABLET | ORAL | 3 refills | Status: DC

## 2020-03-13 NOTE — Patient Instructions (Signed)
I value your feedback and entrusting us with your care. If you get a [facility] patient survey, I would appreciate you taking the time to let us know about your experience today. Thank you!  As of March 04, 2019, your lab results will be released to your MyChart immediately, before I even have a chance to see them. Please give me time to review them and contact you if there are any abnormalities. Thank you for your patience.  

## 2020-03-13 NOTE — Progress Notes (Signed)
Rusty Aus, MD   Chief Complaint  Patient presents with  . Follow-up    Anxiety/depression    HPI:      [name] is a 49 y.o. [id number] whose LMP was Patient's last menstrual period was 03/08/2020 (exact date)., presents today for anxiety/depression f/u, started lexapro 10 mg at 11/21 annual. Feels so much better. More energy/motivation, less social anxiety, sleeping through the night. Still feels overwhelmed occas but much better. No side effects, wants to cont Rx. Taking Fe supp for anemia and has felt better with that, too. 11/21 GAD=15, PHQ=18  11/21 NOTE: Pt very upset about hair thinning and coming out in clumps since 5/21. Takes MVI but doesn't have iron in it. Also waking in middle of night and not sleeping well. Has anxiety/depression sx, feels overwhelmed like something "has to give". Not exercising now because doesn't feel she has time. On lexapro in past with sx improvement. Hasn't taken for yrs. Also diagnosed with ADD in past and did adderall with some improvement. Question was if ADD contributing to stress of not getting things done.   Past Medical History:  Diagnosis Date  . 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 Attention deficit disorder   . BRCA negative 10/2015   MyRisk neg; IBIS=7.3%  . Family history of breast cancer   . Family history of ovarian cancer   . Generalized anxiety disorder   . Menorrhagia with irregular cycle   . UTI (urinary tract infection)     Past Surgical History:  Procedure Laterality Date  . DILATION AND CURETTAGE OF UTERUS     SAB  . TONSILLECTOMY      Family History  Problem Relation Age of Onset  . Ovarian cancer Mother 64       BRCA/MyRisk neg  . Hypertension Father   . Breast cancer Maternal Aunt        39s  . Melanoma Maternal Aunt        66s  . Ovarian cancer Maternal Aunt        15s  . Cancer Other        1s, colon cancer  . Ovarian cancer Paternal Grandmother     Social History    Socioeconomic History  . Marital status: Married    Spouse name: Not on file  . Number of children: Not on file  . Years of education: Not on file  . Highest education level: Not on file  Occupational History  . Not on file  Tobacco Use  . Smoking status: Never Smoker  . Smokeless tobacco: Never Used  Vaping Use  . Vaping Use: Never used  Substance and Sexual Activity  . Alcohol use: Yes    Comment: rarely  . Drug use: Never  . Sexual activity: Yes    Birth control/protection: Surgical    Comment: Vasectomy  Other Topics Concern  . Not on file  Social History Narrative  . Not on file   Social Determinants of Health   Financial Resource Strain: Not on file  Food Insecurity: Not on file  Transportation Needs: Not on file  Physical Activity: Not on file  Stress: Not on file  Social Connections: Not on file  Intimate Partner Violence: Not on file    Outpatient Medications Prior to Visit  Medication Sig Dispense Refill  . escitalopram (LEXAPRO) 10 MG tablet Take 1/2 tab daily for 6 days then 1 tab daily 30 tablet 1   No facility-administered medications  prior to visit.      ROS:  Review of Systems  Constitutional: Negative for fever, malaise/fatigue and weight loss.  Gastrointestinal: Negative for blood in stool, constipation, diarrhea, nausea and vomiting.  Genitourinary: Negative for dyspareunia, dysuria, flank pain, frequency, hematuria, urgency, vaginal bleeding, vaginal discharge and vaginal pain.  Musculoskeletal: Negative for back pain.  Skin: Negative for itching and rash.  Psychiatric/Behavioral: Negative for agitation, dysphoric mood and sleep disturbance.    OBJECTIVE:   Vitals:  BP 130/80   Ht $R'5\' 6"'fc$  (1.676 m)   Wt 186 lb (84.4 kg)   LMP 03/08/2020 (Exact Date)   BMI 30.02 kg/m   Physical Exam Vitals reviewed.  Constitutional:      Appearance: She is well-developed.  Pulmonary:     Effort: Pulmonary effort is normal.  Musculoskeletal:         General: Normal range of motion.     Cervical back: Normal range of motion.  Skin:    General: Skin is warm and dry.  Neurological:     General: No focal deficit present.     Mental Status: She is alert and oriented to person, place, and time.     Cranial Nerves: No cranial nerve deficit.  Psychiatric:        Mood and Affect: Mood normal.        Behavior: Behavior normal.        Thought Content: Thought content normal.        Judgment: Judgment normal.     Results: No results found for this or any previous visit (from the past 24 hour(s)).  GAD 7 : Generalized Anxiety Score 03/13/2020 01/25/2020  Nervous, Anxious, on Edge 1 2  Control/stop worrying 0 3  Worry too much - different things 0 3  Trouble relaxing 0 2  Restless 0 1  Easily annoyed or irritable 0 3  Afraid - awful might happen 0 1  Total GAD 7 Score 1 15  Anxiety Difficulty Not difficult at all Very difficult    Depression screen [facility] 2/9 03/13/2020  Decreased Interest 1  Down, Depressed, Hopeless 0  PHQ - 2 Score 1  Altered sleeping 0  Tired, decreased energy 2  Change in appetite 1  Feeling bad or failure about yourself  0  Trouble concentrating 1  Moving slowly or fidgety/restless 0  Suicidal thoughts 0  PHQ-9 Score 5  Difficult doing work/chores Not difficult at all    Assessment/Plan: Anxiety and depression - Plan: escitalopram (LEXAPRO) 10 MG tablet; much improved, cont lexapro. Rx RF. F/u prn.    Meds ordered this encounter  Medications  . escitalopram (LEXAPRO) 10 MG tablet    Sig: Take 1 tab daily    Dispense:  90 tablet    Refill:  3    Order Specific Question:   Supervising Provider    Answer:   EVERLEAN, BUCHER [[id number]]      Return if symptoms worsen or fail to improve.  Erica Kloeppel B. Aiysha Jillson, PA-C 03/13/2020 3:01 PM

## 2020-03-14 ENCOUNTER — Ambulatory Visit: Payer: 59 | Admitting: Obstetrics and Gynecology

## 2020-11-14 ENCOUNTER — Ambulatory Visit (INDEPENDENT_AMBULATORY_CARE_PROVIDER_SITE_OTHER): Payer: 59 | Admitting: Obstetrics and Gynecology

## 2020-11-14 ENCOUNTER — Encounter: Payer: Self-pay | Admitting: Obstetrics and Gynecology

## 2020-11-14 ENCOUNTER — Other Ambulatory Visit: Payer: Self-pay

## 2020-11-14 ENCOUNTER — Telehealth: Payer: Self-pay

## 2020-11-14 VITALS — BP 108/80 | Ht 66.0 in | Wt 188.0 lb

## 2020-11-14 DIAGNOSIS — N924 Excessive bleeding in the premenopausal period: Secondary | ICD-10-CM

## 2020-11-14 MED ORDER — NORETHINDRONE ACETATE 5 MG PO TABS: 5.0000 mg | ORAL_TABLET | Freq: Every day | ORAL | 0 refills | Status: DC

## 2020-11-14 NOTE — Progress Notes (Signed)
Erica Aus, MD   Chief Complaint  Patient presents with   Menorrhagia    Changing full pad every 45 mins-1 hr, moderate cramping    HPI:      [name] is a 50 y.o. [id number] whose LMP was Patient's last menstrual period was 11/13/2020 (exact date)., presents today for eval of menometrorrhagia since yesterday. Menses are infrequent due to perimenopause (pt unsure of LMP, but I have in notes she at least had one 10/21). Menses usually last 7-14 days with mod dysmen. Discussed ablation with Dr. Kenton Kingfisher in past but menses then became infrequent so hard to schedule and less bothersome. Started with normal amt dysmen 3 days ago and then bleeding started yesterday. Changing products Q45 min without clots. Soiled bed/clothes this AM. Not taking any meds for pain.  She is sex active, partner with vasectomy. Neg pap 1/20; Normal thyroid 11/21, low ferritin 11/21  01/25/20 NOTE: Her menses are irregular now, Q1-3 months, lasting 7-14 days (for the past 3 yrs). Flow is heavy for 3 days, changing super tampon Q 1-2 hrs. Bleeding stops after 6 days total, has a day off, and then a gush occurs again, followed by 3-4 days of light spotting.  Dysmenorrhea moderate. Pt used to have light flow with OCPs in past. S/p neg EMB 2020, discussed ablation with Dr. Kenton Kingfisher 5/21, but not done since then didn't have a period for 3 months.  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   Attention deficit disorder    BRCA negative 10/2015   MyRisk neg; IBIS=7.3%   Family history of breast cancer    Family history of ovarian cancer    Generalized anxiety disorder    Menorrhagia with irregular cycle    UTI (urinary tract infection)     Past Surgical History:  Procedure Laterality Date   DILATION AND CURETTAGE OF UTERUS     SAB   TONSILLECTOMY      Family History  Problem Relation Age of Onset   Ovarian cancer Mother 49       BRCA/MyRisk neg    Hypertension Father    Breast cancer Maternal Aunt        85s   Melanoma Maternal Aunt        49s   Ovarian cancer Maternal Aunt        83s   Cancer Other        40s, colon cancer   Ovarian cancer Paternal Grandmother     Social History   Socioeconomic History   Marital status: Married    Spouse name: Not on file   Number of children: Not on file   Years of education: Not on file   Highest education level: Not on file  Occupational History   Not on file  Tobacco Use   Smoking status: Never   Smokeless tobacco: Never  Vaping Use   Vaping Use: Never used  Substance and Sexual Activity   Alcohol use: Yes    Comment: rarely   Drug use: Never   Sexual activity: Yes    Birth control/protection: Surgical    Comment: Vasectomy  Other Topics Concern   Not on file  Social History Narrative   Not on file   Social Determinants of Health   Financial Resource Strain: Not on file  Food Insecurity: Not on file  Transportation Needs: Not on file  Physical Activity: Not on file  Stress: Not on  file  Social Connections: Not on file  Intimate Partner Violence: Not on file    Outpatient Medications Prior to Visit  Medication Sig Dispense Refill   escitalopram (LEXAPRO) 10 MG tablet Take 1 tab daily 90 tablet 3   No facility-administered medications prior to visit.    ROS:  Review of Systems  Constitutional:  Negative for fever.  Gastrointestinal:  Negative for blood in stool, constipation, diarrhea, nausea and vomiting.  Genitourinary:  Positive for menstrual problem and pelvic pain. Negative for dyspareunia, dysuria, flank pain, frequency, hematuria, urgency, vaginal bleeding, vaginal discharge and vaginal pain.  Musculoskeletal:  Negative for back pain.  Skin:  Negative for rash.  BREAST: No symptoms   OBJECTIVE:   Vitals:  BP 108/80   Ht _0  (1.676 m)   Wt 188 lb (85.3 kg)   LMP 11/13/2020 (Exact Date)   BMI 30.34 kg/m   Physical Exam Vitals reviewed.   Constitutional:      Appearance: She is well-developed.  Pulmonary:     Effort: Pulmonary effort is normal.  Genitourinary:    General: Normal vulva.     Pubic Area: No rash.      Labia:        Right: No rash, tenderness or lesion.        Left: No rash, tenderness or lesion.      Vagina: Bleeding present. No vaginal discharge, erythema or tenderness.     Cervix: Normal.     Uterus: Normal. Not enlarged and not tender.      Adnexa: Right adnexa normal and left adnexa normal.       Right: No mass or tenderness.         Left: No mass or tenderness.    Musculoskeletal:        General: Normal range of motion.     Cervical back: Normal range of motion.  Skin:    General: Skin is warm and dry.  Neurological:     General: No focal deficit present.     Mental Status: She is alert and oriented to person, place, and time.  Psychiatric:        Mood and Affect: Mood normal.        Behavior: Behavior normal.        Thought Content: Thought content normal.        Judgment: Judgment normal.    Assessment/Plan: Excessive bleeding in premenopausal period - Plan: norethindrone (AYGESTIN) 5 MG tablet; pt is perimenopausal, no AUB. Having heavy bleeding with cramping since yesterday. Normal exam today. See if flow slows today/tomorrow, try NSAIDs for pain and flow. If not, can do Rx aygestin to help with flow. Pt to f/u if starts aygestin or if sx persist/worsen; may need GYN u/s. If sx stop, f/u at 11/22 annual/sooner prn.    Meds ordered this encounter  Medications   norethindrone (AYGESTIN) 5 MG tablet    Sig: Take 1 tablet (5 mg total) by mouth daily for 10 days.    Dispense:  10 tablet    Refill:  0    Order Specific Question:   Supervising Provider    Answer:   NAYLA, DIAS [[id number]]       Return in about 3 months (around 02/14/2021), or if symptoms worsen or fail to improve, for annual.  Erica Nilsson B. Mayford Alberg, PA-C 11/14/2020 12:04 PM

## 2020-11-14 NOTE — Telephone Encounter (Signed)
Bradly Chris, RN, one of the after hour nurses called to inform us that pt called in before 8am today stating she was bleeding heavily, changing super tampon every 45 minutes; cramping, back ache, and weak.  Meriam Sprague adv pt to go to ED.  Meriam Sprague states they are required to inform the pt's doctor. Adv I would pass it on.  Called pt to see if she was seen any where.  Pt states she in currently in the office; being seen at 11:30 c [facility].

## 2020-11-21 ENCOUNTER — Other Ambulatory Visit: Payer: Self-pay | Admitting: Obstetrics and Gynecology

## 2020-11-21 DIAGNOSIS — N924 Excessive bleeding in the premenopausal period: Secondary | ICD-10-CM

## 2021-01-31 IMAGING — MG DIGITAL SCREENING BILAT W/ TOMO W/ CAD
8 series · 8 of 24 positions shown · non-contrast
Comparison: Previous exam(s).

CLINICAL DATA: Screening.

EXAM:
DIGITAL SCREENING BILATERAL MAMMOGRAM WITH TOMO AND CAD

[L MLO synth-2D]
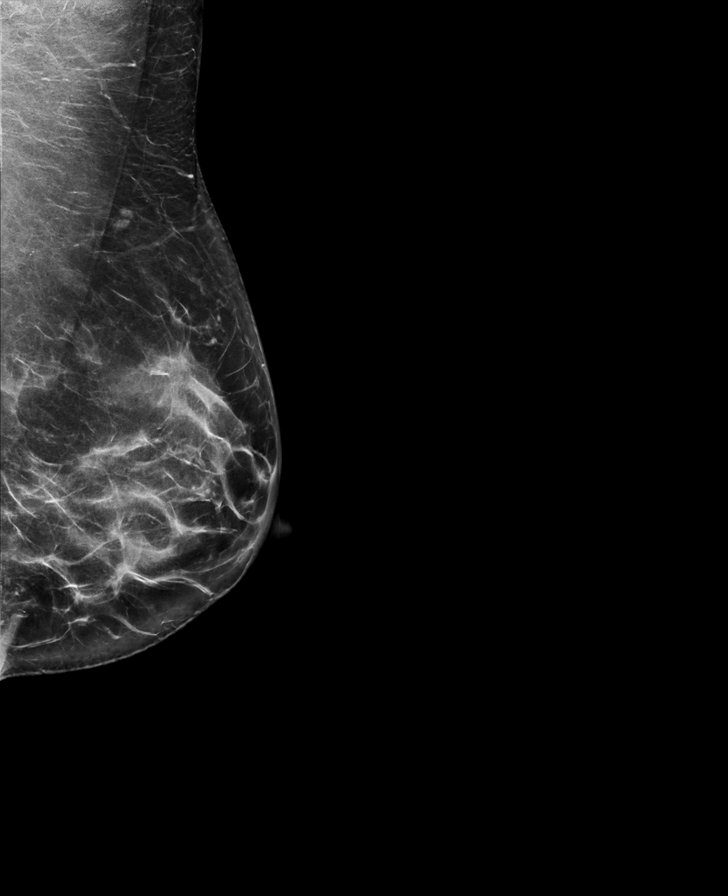

[R MLO synth-2D]
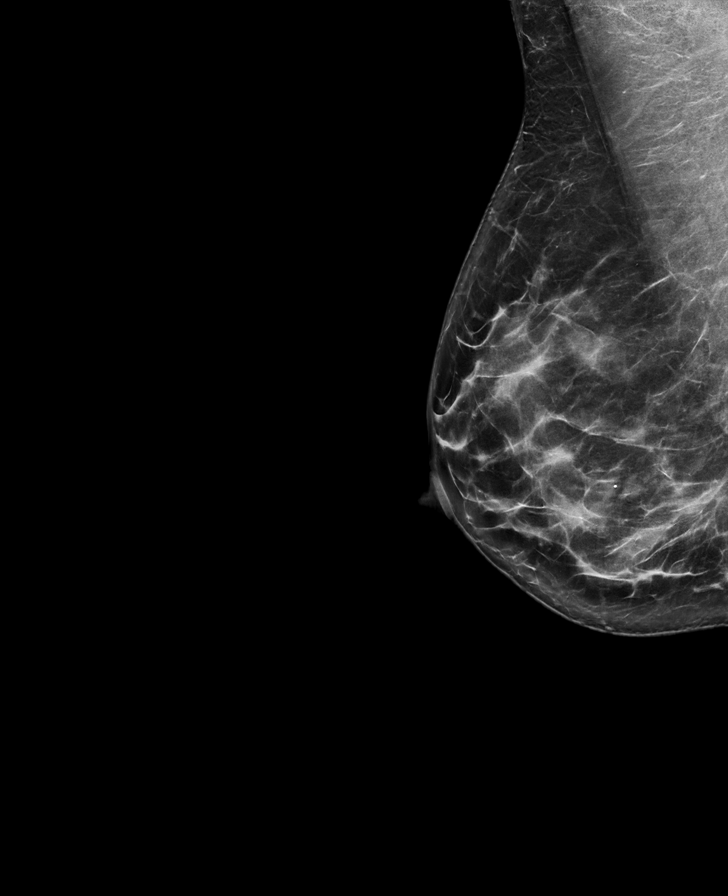

[L CC synth-2D]
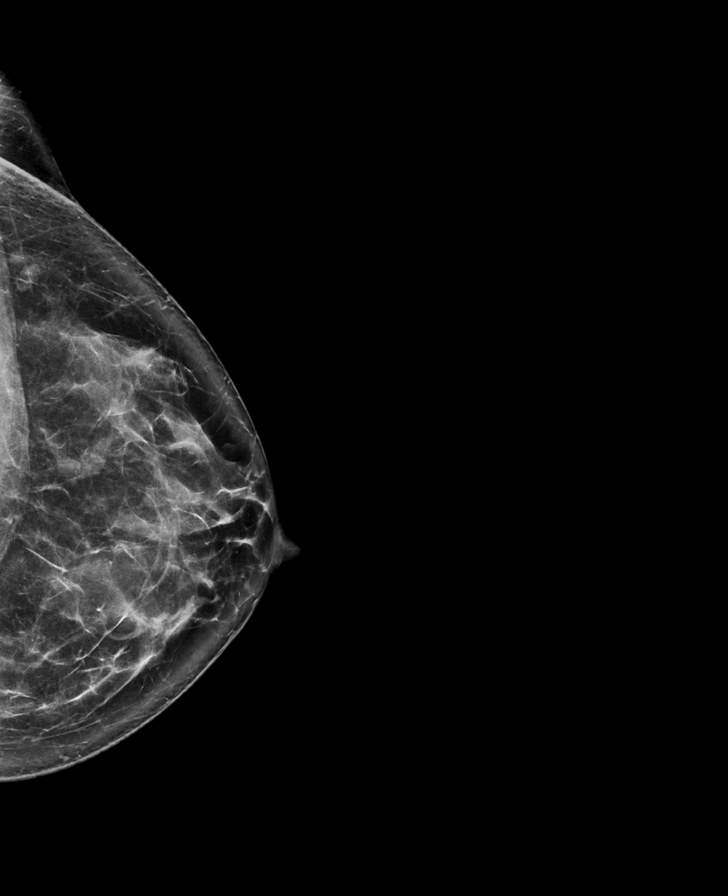

[R CC synth-2D]
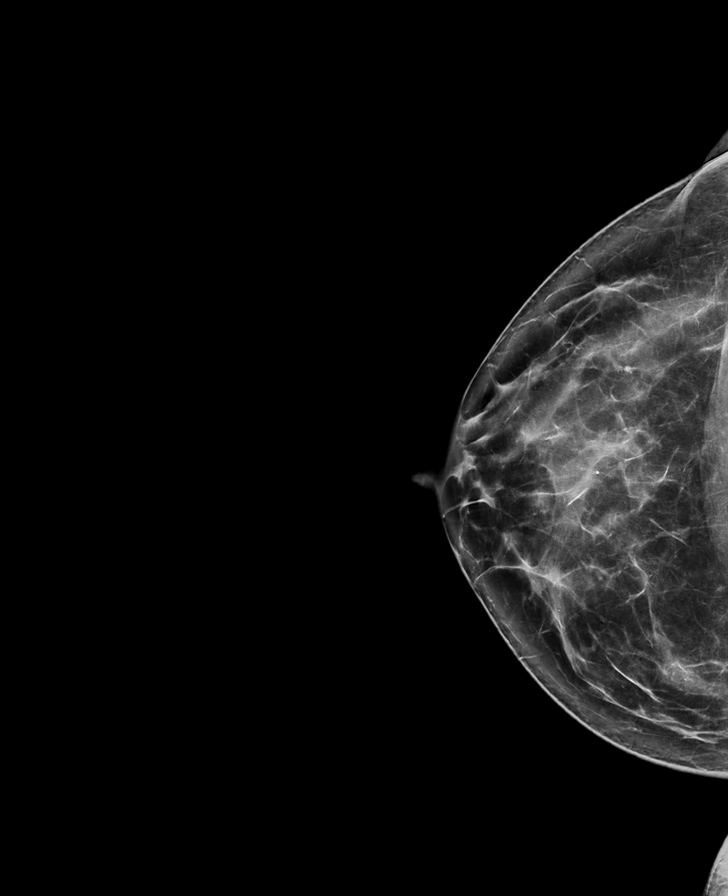

[R MLO tomo · tomo slice 39/77.0]
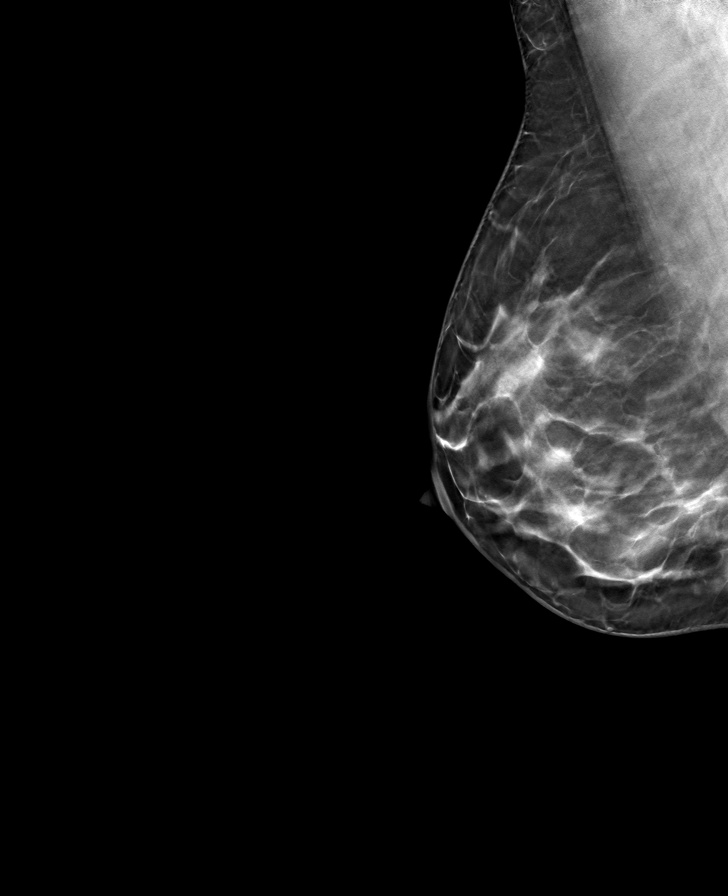

[L MLO tomo · tomo slice 40/79.0]
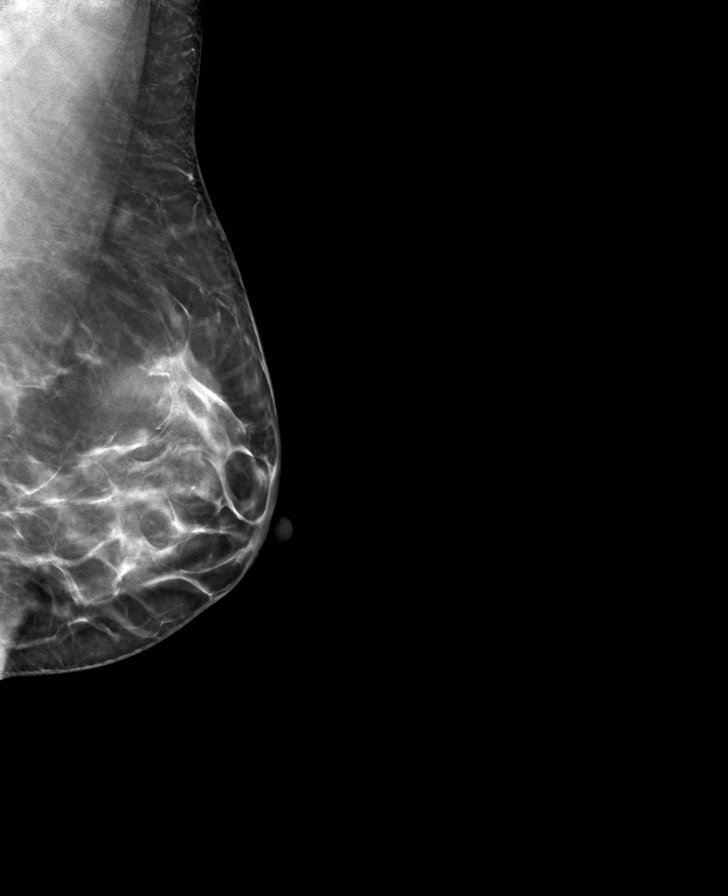

[L CC tomo · tomo slice 39/78.0]
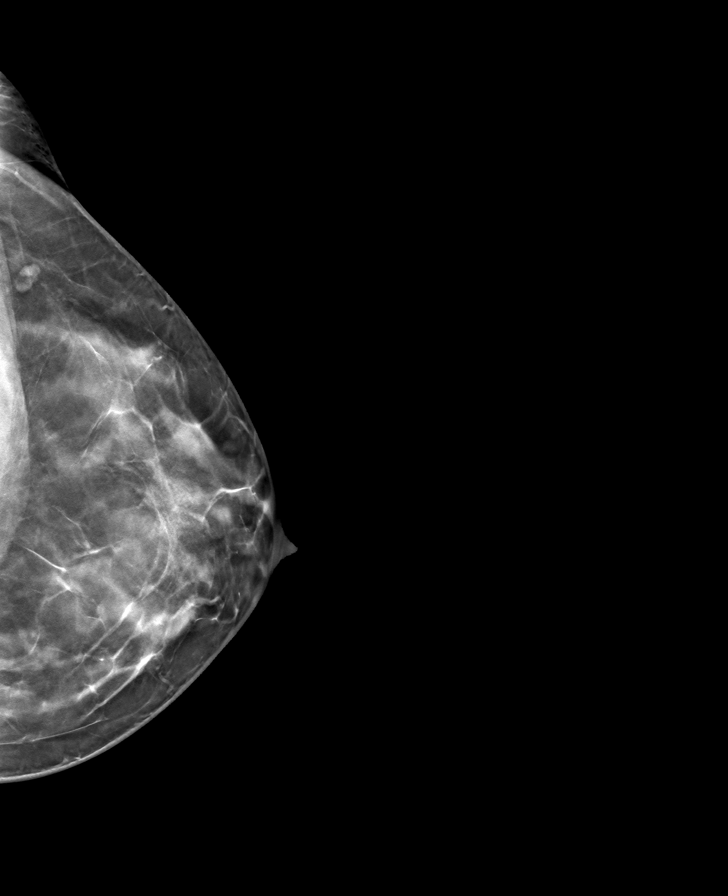

[R CC tomo · tomo slice 39/77.0]
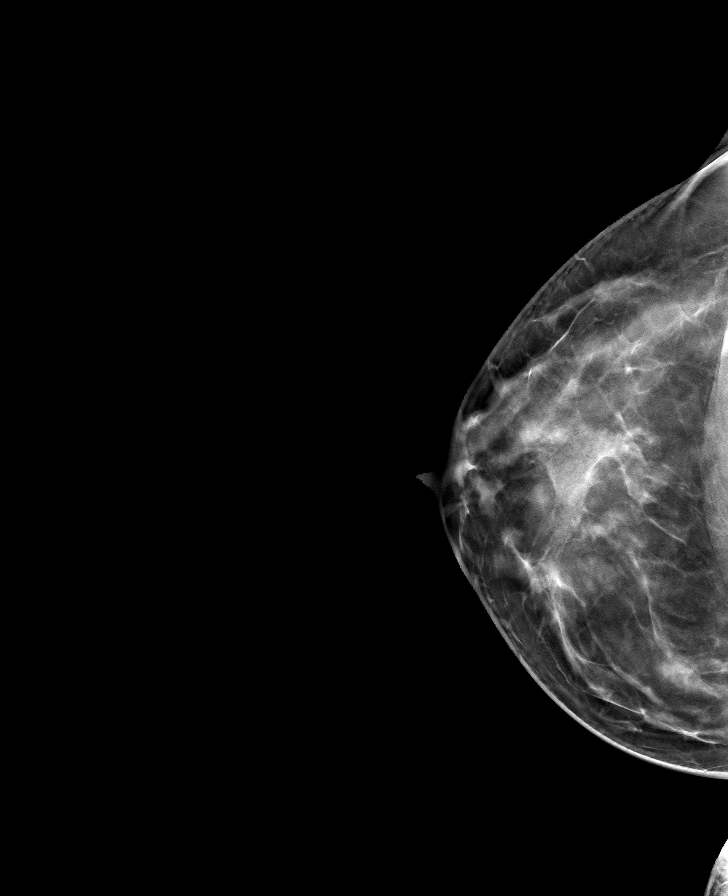

[8 of 24 positions shown; findings below may reference images not displayed]

ACR Breast Density Category c: The breast tissue is heterogeneously
dense, which may obscure small masses.
FINDINGS: There are no findings suspicious for malignancy. Images were
processed with CAD.
IMPRESSION: No mammographic evidence of malignancy. A result letter of this
screening mammogram will be mailed directly to the patient.

RECOMMENDATION:
Screening mammogram in one year. (Code:[id number]-U-LHB)

BI-RADS CATEGORY  1: Negative.

## 2021-02-28 ENCOUNTER — Other Ambulatory Visit: Payer: Self-pay | Admitting: Obstetrics and Gynecology

## 2021-02-28 DIAGNOSIS — F419 Anxiety disorder, unspecified: Secondary | ICD-10-CM

## 2021-02-28 DIAGNOSIS — F32A Depression, unspecified: Secondary | ICD-10-CM

## 2021-03-27 DIAGNOSIS — R053 Chronic cough: Secondary | ICD-10-CM | POA: Diagnosis not present

## 2021-03-27 DIAGNOSIS — J189 Pneumonia, unspecified organism: Secondary | ICD-10-CM | POA: Diagnosis not present

## 2021-03-27 DIAGNOSIS — R059 Cough, unspecified: Secondary | ICD-10-CM | POA: Diagnosis not present

## 2021-04-06 DIAGNOSIS — J189 Pneumonia, unspecified organism: Secondary | ICD-10-CM | POA: Diagnosis not present

## 2021-05-11 ENCOUNTER — Telehealth: Payer: Self-pay

## 2021-05-11 NOTE — Telephone Encounter (Signed)
Pt called our office to see if there was any opening for her irregular bleeding. Says she started bleeding on 04/16/21 and hasn't stopped since then. Is wearing tampon and pad and is having to change them every hour. Passing some clots. No abnormal pain. Advised ABC was not here today, returns Monday. Says she spoke to [facility] earlier today and was advised to pick up Rx from pharmacy to see if it helped with flow. Pt has picked it up and will it give it a try.

## 2021-05-14 ENCOUNTER — Other Ambulatory Visit: Payer: Self-pay | Admitting: Obstetrics and Gynecology

## 2021-05-14 DIAGNOSIS — N924 Excessive bleeding in the premenopausal period: Secondary | ICD-10-CM

## 2021-05-14 DIAGNOSIS — F419 Anxiety disorder, unspecified: Secondary | ICD-10-CM

## 2021-05-14 MED ORDER — NORETHINDRONE ACETATE 5 MG PO TABS: 5.0000 mg | ORAL_TABLET | Freq: Every day | ORAL | 0 refills | Status: DC

## 2021-05-14 NOTE — Progress Notes (Signed)
Pt with heavy bleeding, Rx RF aygestin. Called into [facility] [place] (not in system to eRx). [phone]

## 2021-05-18 ENCOUNTER — Other Ambulatory Visit: Payer: Self-pay | Admitting: Obstetrics and Gynecology

## 2021-05-18 DIAGNOSIS — N924 Excessive bleeding in the premenopausal period: Secondary | ICD-10-CM

## 2021-05-21 NOTE — Progress Notes (Deleted)
Miller, Mark F, MD ° ° °No chief complaint on file. ° ° °HPI: °     [name] is a 50 y.o. G6P5015 whose LMP was No LMP recorded. (Menstrual status: Irregular Periods)., presents today for *** ° ° ° °Patient Active Problem List  ° Diagnosis Date Noted  °• DDD (degenerative disc disease), cervical 02/04/2020  °• ADD (attention deficit disorder) 02/04/2020  °• Menorrhagia with regular cycle 08/03/2019  °• Family history of ovarian cancer 08/03/2019  °• Dysmenorrhea 08/03/2019  ° ° °Past Surgical History:  °Procedure Laterality Date  °• DILATION AND CURETTAGE OF UTERUS    ° SAB  °• TONSILLECTOMY    ° ° °Family History  °Problem Relation Age of Onset  °• Ovarian cancer Mother 60  °     BRCA/MyRisk neg  °• Hypertension Father   °• Breast cancer Maternal Aunt   °     60s  °• Melanoma Maternal Aunt   °     30s  °• Ovarian cancer Maternal Aunt   °     50s  °• Cancer Other   °     40s, colon cancer  °• Ovarian cancer Paternal Grandmother   ° ° °Social History  ° °Socioeconomic History  °• Marital status: Married  °  Spouse name: Not on file  °• Number of children: Not on file  °• Years of education: Not on file  °• Highest education level: Not on file  °Occupational History  °• Not on file  °Tobacco Use  °• Smoking status: Never  °• Smokeless tobacco: Never  °Vaping Use  °• Vaping Use: Never used  °Substance and Sexual Activity  °• Alcohol use: Yes  °  Comment: rarely  °• Drug use: Never  °• Sexual activity: Yes  °  Birth control/protection: Surgical  °  Comment: Vasectomy  °Other Topics Concern  °• Not on file  °Social History Narrative  °• Not on file  ° °Social Determinants of Health  ° °Financial Resource Strain: Not on file  °Food Insecurity: Not on file  °Transportation Needs: Not on file  °Physical Activity: Not on file  °Stress: Not on file  °Social Connections: Not on file  °Intimate Partner Violence: Not on file  ° ° °Outpatient Medications Prior to Visit  °Medication Sig Dispense Refill  °•  escitalopram (LEXAPRO) 10 MG tablet Take 1 tab daily 90 tablet 3  °• norethindrone (AYGESTIN) 5 MG tablet Take 1 tablet (5 mg total) by mouth daily for 10 days. 10 tablet 0  ° °No facility-administered medications prior to visit.  ° ° ° ° °ROS: ° °Review of Systems °BREAST: No symptoms ° ° °OBJECTIVE:  ° °Vitals:  °There were no vitals taken for this visit. ° °Physical Exam ° °Results: °No results found for this or any previous visit (from the past 24 hour(s)). ° ° °Assessment/Plan: °No diagnosis found. ° ° ° °No orders of the defined types were placed in this encounter. ° ° ° ° No follow-ups on file. ° °Alicia B. Copland, PA-C °05/21/2021 °4:39 PM ° ° ° ° ° °

## 2021-05-22 ENCOUNTER — Other Ambulatory Visit: Payer: Self-pay

## 2021-05-22 ENCOUNTER — Ambulatory Visit (INDEPENDENT_AMBULATORY_CARE_PROVIDER_SITE_OTHER): Payer: 59 | Admitting: Obstetrics and Gynecology

## 2021-05-22 ENCOUNTER — Ambulatory Visit: Payer: 59 | Admitting: Obstetrics and Gynecology

## 2021-05-22 ENCOUNTER — Other Ambulatory Visit (HOSPITAL_COMMUNITY)
Admission: RE | Admit: 2021-05-22 | Discharge: 2021-05-22 | Disposition: A | Discharge status: Home / Self Care | Payer: 59 | Source: Ambulatory Visit | Attending: Obstetrics and Gynecology | Admitting: Obstetrics and Gynecology

## 2021-05-22 ENCOUNTER — Encounter: Payer: Self-pay | Admitting: Obstetrics and Gynecology

## 2021-05-22 VITALS — BP 144/90 | Ht 66.0 in | Wt 194.0 lb

## 2021-05-22 DIAGNOSIS — Z1211 Encounter for screening for malignant neoplasm of colon: Secondary | ICD-10-CM

## 2021-05-22 DIAGNOSIS — Z130 Encounter for screening for diseases of the blood and blood-forming organs and certain disorders involving the immune mechanism: Secondary | ICD-10-CM | POA: Diagnosis not present

## 2021-05-22 DIAGNOSIS — Z1151 Encounter for screening for human papillomavirus (HPV): Secondary | ICD-10-CM | POA: Insufficient documentation

## 2021-05-22 DIAGNOSIS — N921 Excessive and frequent menstruation with irregular cycle: Secondary | ICD-10-CM

## 2021-05-22 DIAGNOSIS — Z124 Encounter for screening for malignant neoplasm of cervix: Secondary | ICD-10-CM | POA: Insufficient documentation

## 2021-05-22 DIAGNOSIS — Z01419 Encounter for gynecological examination (general) (routine) without abnormal findings: Secondary | ICD-10-CM

## 2021-05-22 DIAGNOSIS — D500 Iron deficiency anemia secondary to blood loss (chronic): Secondary | ICD-10-CM

## 2021-05-22 DIAGNOSIS — Z1329 Encounter for screening for other suspected endocrine disorder: Secondary | ICD-10-CM | POA: Diagnosis not present

## 2021-05-22 DIAGNOSIS — Z8041 Family history of malignant neoplasm of ovary: Secondary | ICD-10-CM

## 2021-05-22 DIAGNOSIS — N939 Abnormal uterine and vaginal bleeding, unspecified: Secondary | ICD-10-CM | POA: Insufficient documentation

## 2021-05-22 DIAGNOSIS — F32A Depression, unspecified: Secondary | ICD-10-CM

## 2021-05-22 DIAGNOSIS — F419 Anxiety disorder, unspecified: Secondary | ICD-10-CM

## 2021-05-22 DIAGNOSIS — Z1231 Encounter for screening mammogram for malignant neoplasm of breast: Secondary | ICD-10-CM | POA: Diagnosis not present

## 2021-05-22 DIAGNOSIS — R69 Illness, unspecified: Secondary | ICD-10-CM | POA: Diagnosis not present

## 2021-05-22 DIAGNOSIS — N858 Other specified noninflammatory disorders of uterus: Secondary | ICD-10-CM | POA: Diagnosis not present

## 2021-05-22 MED ORDER — ESCITALOPRAM OXALATE 10 MG PO TABS: ORAL_TABLET | ORAL | 3 refills | Status: AC

## 2021-05-22 MED ORDER — NORETHINDRONE ACETATE 5 MG PO TABS: 5.0000 mg | ORAL_TABLET | Freq: Every day | ORAL | 0 refills | Status: DC

## 2021-05-22 NOTE — Progress Notes (Signed)
PCP:  Rusty Aus, MD   Chief Complaint  Patient presents with   Gynecologic Exam    Bleeding since December, heavy at times, huge clots, normal cramping     HPI:      Erica Haynes is a 51 y.o. No obstetric history on file. who LMP was Patient's last menstrual period was 03/19/2021 (approximate)., presents today for her annual examination.  Her menses have been irregular  Q1-3 months, lasting 14 days (for the past 4-5 yrs). Flow is heavy for 3 days, changing super tampon Q 1-2 hrs, no clots. Bleeding stops after 6 days total, has a day off, and then a gush occurs again, followed by 3-4 days of light spotting.  Dysmenorrhea moderate.  Pt started bleeding 03/19/21 and has been bleeding daily since. Changing products Q30-45 min on heavy days, with large clots (largest about grapefruit size); no dysmen. Pt called me 1/23 and Rx aygestin 5 mg given (pt then went out of town). Flow has lightened to changing pad daily, but hasn't stopped and has mild dysmen now. Taking Fe supp. Pt had discussed ablation with Dr. Kenton Kingfisher in past but then went about 6 months without a period, so never had it done. S/p neg EMB 05/2018. Had normal GYN u/s 5/21 for AUB; EM=9.3 mm. Pt used to have light flow with OCPs in past but would rather not be on hormones. Had bad experience with IUD.   Has anxiety/depression sx, restarted lexapro last yr, doing better. Still doesn't have energy/drive that she used to have but does feel better. Still not exercising. Would like Rx RF. Took adderall in past,  wonders if ADD contributing to stress of not getting things done.   Sex activity: single partner, contraception - vasectomy.  Last Pap: 04/01/18  Results were: no abnormalities /neg HPV DNA  Hx of STDs: none  Last mammogram: 03/07/20 Results were normal, repeat in 12 months There is a FH of breast cancer in her mat aunt. There is a FH of ovarian cancer in her mom and mat aunt, as well as PGM. Her mom is BRCA/"update  testing" neg 2017. Pt is MyRisk neg 2017. IBIS=7.3%. The patient does do self-breast exams.  Tobacco use: The patient denies current or previous tobacco use. Alcohol use: social drinker No drug use.  Exercise: min active. Was exercising with wt loss and felt really good, but then stopped.   She does get adequate calcium and Vitamin D in her diet.  Colonoscopy: never  No recent lipids, not fasting today.  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   Attention deficit disorder    BRCA negative 10/2015   MyRisk neg; IBIS=7.3%   Family history of breast cancer    Family history of ovarian cancer    Generalized anxiety disorder    Menorrhagia with irregular cycle    UTI (urinary tract infection)     Past Surgical History:  Procedure Laterality Date   DILATION AND CURETTAGE OF UTERUS     SAB   TONSILLECTOMY      Family History  Problem Relation Age of Onset   Ovarian cancer Mother 60       BRCA/MyRisk neg   Hypertension Father    Breast cancer Maternal Aunt        60s   Melanoma Maternal Aunt        54s   Ovarian cancer Maternal Aunt        60s  Cancer Other        24s, colon cancer   Ovarian cancer Paternal Grandmother     Social History   Socioeconomic History   Marital status: Married    Spouse name: Not on file   Number of children: Not on file   Years of education: Not on file   Highest education level: Not on file  Occupational History   Not on file  Tobacco Use   Smoking status: Never   Smokeless tobacco: Never  Vaping Use   Vaping Use: Never used  Substance and Sexual Activity   Alcohol use: Yes    Comment: rarely   Drug use: Never   Sexual activity: Yes    Birth control/protection: Surgical    Comment: Vasectomy  Other Topics Concern   Not on file  Social History Narrative   Not on file   Social Determinants of Health   Financial Resource Strain: Not on file  Food Insecurity: Not  on file  Transportation Needs: Not on file  Physical Activity: Not on file  Stress: Not on file  Social Connections: Not on file  Intimate Partner Violence: Not on file    Outpatient Medications Prior to Visit  Medication Sig Dispense Refill   escitalopram (LEXAPRO) 10 MG tablet Take 1 tab daily 90 tablet 3   norethindrone (AYGESTIN) 5 MG tablet Take 1 tablet (5 mg total) by mouth daily for 10 days. 10 tablet 0   No facility-administered medications prior to visit.      ROS:  Review of Systems  Constitutional:  Positive for fatigue. Negative for fever and unexpected weight change.  Respiratory:  Negative for cough, shortness of breath and wheezing.   Cardiovascular:  Negative for chest pain, palpitations and leg swelling.  Gastrointestinal:  Negative for blood in stool, constipation, diarrhea, nausea and vomiting.  Endocrine: Negative for cold intolerance, heat intolerance and polyuria.  Genitourinary:  Positive for menstrual problem. Negative for dyspareunia, dysuria, flank pain, frequency, genital sores, hematuria, pelvic pain, urgency, vaginal bleeding, vaginal discharge and vaginal pain.  Musculoskeletal:  Negative for arthralgias, back pain, joint swelling and myalgias.  Skin:  Negative for rash.  Neurological:  Positive for dizziness. Negative for syncope, light-headedness, numbness and headaches.  Hematological:  Negative for adenopathy.  Psychiatric/Behavioral:  Positive for agitation. Negative for confusion, dysphoric mood, sleep disturbance and suicidal ideas. The patient is not nervous/anxious.    Objective: BP (!) 144/90    Ht _0  (1.676 m)    Wt 194 lb (88 kg)    LMP 03/19/2021 (Approximate)    BMI 31.31 kg/m    Physical Exam Constitutional:      Appearance: She is well-developed.  Genitourinary:     Vulva normal.     Right Labia: No rash, tenderness or lesions.    Left Labia: No tenderness, lesions or rash.    Vaginal bleeding present.     No vaginal  discharge, erythema or tenderness.      Right Adnexa: not tender and no mass present.    Left Adnexa: not tender and no mass present.    No cervical friability or polyp.     Uterus is irregular.     Uterus is not enlarged or tender.  Breasts:    Right: No mass, nipple discharge, skin change or tenderness.     Left: No mass, nipple discharge, skin change or tenderness.  Neck:     Thyroid: No thyromegaly.  Cardiovascular:     Rate and  Rhythm: Normal rate and regular rhythm.     Heart sounds: Normal heart sounds. No murmur heard. Pulmonary:     Effort: Pulmonary effort is normal.     Breath sounds: Normal breath sounds.  Abdominal:     Palpations: Abdomen is soft.     Tenderness: There is no abdominal tenderness. There is no guarding or rebound.  Musculoskeletal:        General: Normal range of motion.     Cervical back: Normal range of motion.  Lymphadenopathy:     Cervical: No cervical adenopathy.  Neurological:     General: No focal deficit present.     Mental Status: She is alert and oriented to person, place, and time.     Cranial Nerves: No cranial nerve deficit.  Skin:    General: Skin is warm and dry.  Psychiatric:        Mood and Affect: Mood normal.        Behavior: Behavior normal.        Thought Content: Thought content normal.        Judgment: Judgment normal.  Vitals reviewed.    Endometrial Biopsy After discussion with the patient regarding her abnormal uterine bleeding I recommended that she proceed with an endometrial biopsy for further diagnosis. The risks, benefits, alternatives, and indications for an endometrial biopsy were discussed with the patient in detail.  Verbal consent was obtained.   PROCEDURE NOTE:  Pipelle endometrial biopsy was performed using aseptic technique with iodine preparation.  The uterus was sounded to a length of 9.0 cm.  Adequate sampling was obtained with minimal blood loss.  The patient tolerated the procedure well.  Disposition  will be pending pathology.   Assessment/Plan: Encounter for annual routine gynecological examination  Cervical cancer screening - Plan: Cytology - PAP  Screening for HPV (human papillomavirus) - Plan: Cytology - PAP  Encounter for screening mammogram for malignant neoplasm of breast - Plan: MM 3D SCREEN BREAST BILATERAL; pt to schedule mammo  Family history of ovarian cancer--pt is MyRisk negative.   Screening for colon cancer - Plan: Cologuard; colonoscopy/cologuard discussed. Pt elects cologuard. Ref sent. Will f/u with results.  Menorrhagia with irregular cycle - Plan: norethindrone (AYGESTIN) 5 MG tablet, US PELVIS TRANSVAGINAL NON-OB (TV ONLY);   Abnormal uterine bleeding (AUB) - Plan: CBC with Differential/Platelet, TSH + free T4, Surgical pathology, norethindrone (AYGESTIN) 5 MG tablet, US PELVIS TRANSVAGINAL NON-OB (TV ONLY); sx since 12/22; check labs, pap, EMB, Gyn u/s. Will f/u with results. Pt would like ablation. RTO with MD after GYN u/s for ablation conf. Cont Fe supp  Thyroid disorder screening - Plan: TSH + free T4  Screening for deficiency anemia - Plan: CBC with Differential/Platelet  Anxiety and depression - Plan: escitalopram (LEXAPRO) 10 MG tablet; Rx RF.    Meds ordered this encounter  Medications   norethindrone (AYGESTIN) 5 MG tablet    Sig: Take 1 tablet (5 mg total) by mouth daily for 14 days.    Dispense:  14 tablet    Refill:  0    Order Specific Question:   Supervising Provider    Answer:   NISSA, STANNARD [[id number]]   escitalopram (LEXAPRO) 10 MG tablet    Sig: Take 1 tab daily    Dispense:  90 tablet    Refill:  3    Order Specific Question:   Supervising Provider    Answer:   MAKILAH, DOWDA [[id number]]  GYN counsel breast self exam, mammography screening, adequate intake of calcium and vitamin D, diet and exercise     F/U  Return in about 16 days (around 06/07/2021) for GYN u/s for AUB/MD appt afterwards for endometrial ablation  consult.  Quoc Tome B. Talley Kreiser, PA-C 05/22/2021 11:24 AM

## 2021-05-22 NOTE — Patient Instructions (Signed)
I value your feedback and you entrusting us with your care. If you get a [facility] patient survey, I would appreciate you taking the time to let us know about your experience today. Thank you!  [facility]: [phone]      

## 2021-05-23 LAB — CBC WITH DIFFERENTIAL/PLATELET
Basophils Absolute: 0.1 10*3/uL (ref 0.0–0.2)
Basos: 1 %
EOS (ABSOLUTE): 0.2 10*3/uL (ref 0.0–0.4)
Eos: 3 %
Hematocrit: 28.4 % — ABNORMAL LOW (ref 34.0–46.6)
Hemoglobin: 8.9 g/dL — ABNORMAL LOW (ref 11.1–15.9)
Immature Grans (Abs): 0 10*3/uL (ref 0.0–0.1)
Immature Granulocytes: 0 %
Lymphocytes Absolute: 1.5 10*3/uL (ref 0.7–3.1)
Lymphs: 22 %
MCH: 28.7 pg (ref 26.6–33.0)
MCHC: 31.3 g/dL — ABNORMAL LOW (ref 31.5–35.7)
MCV: 92 fL (ref 79–97)
Monocytes Absolute: 0.4 10*3/uL (ref 0.1–0.9)
Monocytes: 5 %
Neutrophils Absolute: 4.6 10*3/uL (ref 1.4–7.0)
Neutrophils: 69 %
Platelets: 297 10*3/uL (ref 150–450)
RBC: 3.1 x10E6/uL — ABNORMAL LOW (ref 3.77–5.28)
RDW: 14.1 % (ref 11.7–15.4)
WBC: 6.7 10*3/uL (ref 3.4–10.8)

## 2021-05-23 LAB — TSH+FREE T4
Free T4: 1.11 ng/dL (ref 0.82–1.77)
TSH: 1.15 u[IU]/mL (ref 0.450–4.500)

## 2021-05-23 NOTE — Addendum Note (Signed)
Addended by: Althea Grimmer B on: 05/23/2021 12:00 PM   Modules accepted: Orders

## 2021-05-24 LAB — CYTOLOGY - PAP
Comment: NEGATIVE
Diagnosis: NEGATIVE
High risk HPV: NEGATIVE

## 2021-05-24 LAB — SURGICAL PATHOLOGY

## 2021-05-31 ENCOUNTER — Other Ambulatory Visit: Payer: Self-pay

## 2021-05-31 ENCOUNTER — Other Ambulatory Visit: Payer: 59

## 2021-05-31 DIAGNOSIS — D500 Iron deficiency anemia secondary to blood loss (chronic): Secondary | ICD-10-CM | POA: Diagnosis not present

## 2021-06-01 ENCOUNTER — Inpatient Hospital Stay: Payer: 59 | Attending: Oncology | Admitting: Oncology

## 2021-06-01 ENCOUNTER — Inpatient Hospital Stay: Payer: 59

## 2021-06-01 ENCOUNTER — Encounter: Payer: Self-pay | Admitting: Oncology

## 2021-06-01 VITALS — BP 147/69 | HR 87 | Temp 98.9°F | Ht 66.0 in | Wt 193.0 lb

## 2021-06-01 DIAGNOSIS — D509 Iron deficiency anemia, unspecified: Secondary | ICD-10-CM

## 2021-06-01 DIAGNOSIS — Z8041 Family history of malignant neoplasm of ovary: Secondary | ICD-10-CM | POA: Insufficient documentation

## 2021-06-01 DIAGNOSIS — D500 Iron deficiency anemia secondary to blood loss (chronic): Secondary | ICD-10-CM | POA: Insufficient documentation

## 2021-06-01 DIAGNOSIS — Z800 Family history of malignant neoplasm of digestive organs: Secondary | ICD-10-CM | POA: Insufficient documentation

## 2021-06-01 DIAGNOSIS — Z803 Family history of malignant neoplasm of breast: Secondary | ICD-10-CM | POA: Insufficient documentation

## 2021-06-01 DIAGNOSIS — N920 Excessive and frequent menstruation with regular cycle: Secondary | ICD-10-CM | POA: Insufficient documentation

## 2021-06-01 DIAGNOSIS — Z808 Family history of malignant neoplasm of other organs or systems: Secondary | ICD-10-CM

## 2021-06-01 HISTORY — DX: Iron deficiency anemia, unspecified: D50.9

## 2021-06-01 LAB — IRON,TIBC AND FERRITIN PANEL
Ferritin: 11 ng/mL — ABNORMAL LOW (ref 15–150)
Iron Saturation: 4 % — CL (ref 15–55)
Iron: 16 ug/dL — ABNORMAL LOW (ref 27–159)
Total Iron Binding Capacity: 369 ug/dL (ref 250–450)
UIBC: 353 ug/dL (ref 131–425)

## 2021-06-01 NOTE — Progress Notes (Signed)
?Hematology/Oncology Consult note ?[phone] [phone] ?  ? ?   ? ? ?Patient Care Team: ?Rusty Aus, MD as PCP - General (Internal Medicine) ? ?REFERRING PROVIDER: ?Copland, Alicia B, PA-C  ?CHIEF COMPLAINTS/REASON FOR VISIT:  ?Evaluation of iron deficiency anemia.  ? ?HISTORY OF PRESENTING ILLNESS:  ? ?Erica Haynes is a  51 y.o.  female with PMH listed below was seen in consultation at the request of  Copland, Elmo Putt B, PA-C  for evaluation of iron deficiency anemia.  ? ?She has heavy menstrual period. + fatigue, SOB, decreased energy level. She has had some GI issue, with diarrhea. Denies melena, blood in the stool. ?She had blood work done on 2/28/203. Cbc showed hemoglobin 8.9 mcv 92. Iron panel showed iron saturation 4, ferritin 11 ? ?05/22/2021 Endometrium biopsy showed Benign mixed pattern endometrium with marked progestational effect Stromal breakdown consistent with bleeding Negative for polyp, hyperplasia and carcinoma. Cytology showed - Negative for intraepithelial lesion or malignancy (NILM) ? ?Reviewed her previous records, anemia onset is new. Iron deficiency showed decreased ferritin of 5 on 01/25/20. ?She has taken oral iron supplementation -Optiferrin ? ?Review of Systems  ?Constitutional:  Positive for fatigue. Negative for appetite change, chills and fever.  ?HENT:   Negative for hearing loss and voice change.   ?Eyes:  Negative for eye problems.  ?Respiratory:  Positive for shortness of breath. Negative for chest tightness and cough.   ?Cardiovascular:  Negative for chest pain.  ?Gastrointestinal:  Negative for abdominal distention, abdominal pain and blood in stool.  ?Endocrine: Negative for hot flashes.  ?Genitourinary:  Positive for menstrual problem. Negative for difficulty urinating and frequency.   ?Musculoskeletal:  Negative for arthralgias.  ?Skin:  Negative for itching and rash.  ?Neurological:  Negative for extremity weakness.  ?Hematological:  Negative for  adenopathy.  ?Psychiatric/Behavioral:  Negative for confusion.   ? ?MEDICAL HISTORY:  ?Past Medical History:  ?Diagnosis Date  ? 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 Attention deficit disorder   ? BRCA negative 10/2015  ? MyRisk neg; IBIS=7.3%  ? Family history of breast cancer   ? Family history of ovarian cancer   ? Generalized anxiety disorder   ? IDA (iron deficiency anemia) 06/01/2021  ? Menorrhagia with irregular cycle   ? UTI (urinary tract infection)   ? ? ?SURGICAL HISTORY: ?Past Surgical History:  ?Procedure Laterality Date  ? DILATION AND CURETTAGE OF UTERUS    ? SAB  ? TONSILLECTOMY    ? ? ?SOCIAL HISTORY: ?Social History  ? ?Socioeconomic History  ? Marital status: Married  ?  Spouse name: Not on file  ? Number of children: Not on file  ? Years of education: Not on file  ? Highest education level: Not on file  ?Occupational History  ? Not on file  ?Tobacco Use  ? Smoking status: Never  ? Smokeless tobacco: Never  ?Vaping Use  ? Vaping Use: Never used  ?Substance and Sexual Activity  ? Alcohol use: Yes  ?  Comment: rarely  ? Drug use: Never  ? Sexual activity: Yes  ?  Birth control/protection: Surgical  ?  Comment: Vasectomy  ?Other Topics Concern  ? Not on file  ?Social History Narrative  ? Not on file  ? ?Social Determinants of Health  ? ?Financial Resource Strain: Not on file  ?Food Insecurity: Not on file  ?Transportation Needs: Not on file  ?Physical Activity: Not on file  ?Stress: Not on file  ?Social Connections: Not  on file  ?Intimate Partner Violence: Not on file  ? ? ?FAMILY HISTORY: ?Family History  ?Problem Relation Age of Onset  ? Ovarian cancer Mother 56  ?     BRCA/MyRisk neg  ? Hypertension Father   ? Breast cancer Maternal Aunt   ?     39s  ? Melanoma Maternal Aunt   ?     32s  ? Ovarian cancer Maternal Aunt   ?     29s  ? Cancer Other   ?     98s, colon cancer  ? Ovarian cancer Paternal Grandmother   ? ? ?ALLERGIES:  has No Known Allergies. ? ?MEDICATIONS:   ?Current Outpatient Medications  ?Medication Sig Dispense Refill  ? escitalopram (LEXAPRO) 10 MG tablet Take 1 tab daily 90 tablet 3  ? norethindrone (AYGESTIN) 5 MG tablet Take 1 tablet (5 mg total) by mouth daily for 14 days. (Patient not taking: Reported on 06/01/2021) 14 tablet 0  ? ?No current facility-administered medications for this visit.  ? ? ? ?PHYSICAL EXAMINATION: ?Vitals:  ? 06/01/21 1117  ?BP: (!) 147/69  ?Pulse: 87  ?Temp: 98.9 ?F (37.2 ?C)  ? ?Filed Weights  ? 06/01/21 1117  ?Weight: 193 lb (87.5 kg)  ? ? ?Physical Exam ?Constitutional:   ?   General: She is not in acute distress. ?HENT:  ?   Head: Normocephalic and atraumatic.  ?Eyes:  ?   General: No scleral icterus. ?Cardiovascular:  ?   Rate and Rhythm: Normal rate and regular rhythm.  ?   Heart sounds: Normal heart sounds.  ?Pulmonary:  ?   Effort: Pulmonary effort is normal. No respiratory distress.  ?   Breath sounds: No wheezing.  ?Abdominal:  ?   General: Bowel sounds are normal. There is no distension.  ?   Palpations: Abdomen is soft.  ?Musculoskeletal:     ?   General: No deformity. Normal range of motion.  ?   Cervical back: Normal range of motion and neck supple.  ?Skin: ?   General: Skin is warm and dry.  ?   Findings: No erythema or rash.  ?Neurological:  ?   Mental Status: She is alert and oriented to person, place, and time. Mental status is at baseline.  ?   Cranial Nerves: No cranial nerve deficit.  ?   Coordination: Coordination normal.  ?Psychiatric:     ?   Mood and Affect: Mood normal.  ? ? ?LABORATORY DATA:  ?I have reviewed the data as listed ?Lab Results  ?Component Value Date  ? WBC 6.7 05/22/2021  ? HGB 8.9 (L) 05/22/2021  ? HCT 28.4 (L) 05/22/2021  ? MCV 92 05/22/2021  ? PLT 297 05/22/2021  ? ?No results for input(s): NA, K, CL, CO2, GLUCOSE, BUN, CREATININE, CALCIUM, GFRNONAA, GFRAA, PROT, ALBUMIN, AST, ALT, ALKPHOS, BILITOT, BILIDIR, IBILI in the last 8760 hours. ?Iron/TIBC/Ferritin/ %Sat ?   ?Component Value  Date/Time  ? IRON 16 (L) 05/31/2021 1429  ? TIBC 369 05/31/2021 1429  ? FERRITIN 11 (L) 05/31/2021 1429  ? IRONPCTSAT 4 (LL) 05/31/2021 1429  ?  ? ? ?RADIOGRAPHIC STUDIES: ?I have personally reviewed the radiological images as listed and agreed with the findings in the report. ?No results found. ? ? ? ?ASSESSMENT & PLAN:  ?1. Menorrhagia with regular cycle   ?2. Iron deficiency anemia due to chronic blood loss   ? ?# iron deficiency anemia due to blood loss  ?Labs are reviewed and discussed with patient. ?  Option of continue oral iron supplementation vs IV Venofer was discussed with patient.  ?Risks of infusion reactions including anaphylactic reactions were discussed with patient. Other side effects include but not limited to high blood pressure, headache,wheezing, SOB, skin rash and itchiness, weight gain, leg swelling, etc.  Patient voices understanding and she opted to proceed with IV Venofer. Recommend IV venofer weekly x 4.  ?She denies any chance of being pregnancy. Husband had vasotomy. ? ?Menorrhagia, continue follow up with gyn.  ?Recommend patient to see GI for evaluation of diarrhea, consider colonoscopy.  ? ?Orders Placed This Encounter  ?Procedures  ? CBC with Differential/Platelet  ?  Standing Status:   Future  ?  Standing Expiration Date:   06/02/2022  ? Ferritin  ?  Standing Status:   Future  ?  Standing Expiration Date:   06/02/2022  ? Iron and TIBC  ?  Standing Status:   Future  ?  Standing Expiration Date:   06/02/2022  ?  ?All questions were answered. The patient knows to call the clinic with any problems questions or concerns. ? ? ?Copland, Deirdre Evener, PA-C  ? ? ?Return of visit: 3 months.  ?Thank you for this kind referral and the opportunity to participate in the care of this patient. A copy of today's note is routed to referring provider  ? ?Earlie Server, MD, PhD ?[facility] Hematology Oncology ?06/01/2021 ? ? ?

## 2021-06-02 ENCOUNTER — Encounter: Payer: Self-pay | Admitting: Oncology

## 2021-06-04 ENCOUNTER — Other Ambulatory Visit: Payer: Self-pay

## 2021-06-04 ENCOUNTER — Inpatient Hospital Stay: Payer: 59

## 2021-06-04 VITALS — BP 118/75 | HR 78

## 2021-06-04 DIAGNOSIS — Z8041 Family history of malignant neoplasm of ovary: Secondary | ICD-10-CM | POA: Diagnosis not present

## 2021-06-04 DIAGNOSIS — D500 Iron deficiency anemia secondary to blood loss (chronic): Secondary | ICD-10-CM | POA: Diagnosis not present

## 2021-06-04 DIAGNOSIS — N920 Excessive and frequent menstruation with regular cycle: Secondary | ICD-10-CM | POA: Diagnosis not present

## 2021-06-04 DIAGNOSIS — Z800 Family history of malignant neoplasm of digestive organs: Secondary | ICD-10-CM | POA: Diagnosis not present

## 2021-06-04 DIAGNOSIS — Z803 Family history of malignant neoplasm of breast: Secondary | ICD-10-CM | POA: Diagnosis not present

## 2021-06-04 DIAGNOSIS — Z808 Family history of malignant neoplasm of other organs or systems: Secondary | ICD-10-CM | POA: Diagnosis not present

## 2021-06-04 MED ORDER — IRON SUCROSE 20 MG/ML IV SOLN
200.0000 mg | Freq: Once | INTRAVENOUS | Status: AC
  Administered 2021-06-04: 200 mg via INTRAVENOUS
  Filled 2021-06-04: qty 10

## 2021-06-04 MED ORDER — SODIUM CHLORIDE 0.9 % IV SOLN
Freq: Once | INTRAVENOUS | Status: AC
  Filled 2021-06-04: qty 250

## 2021-06-04 MED ORDER — SODIUM CHLORIDE 0.9 % IV SOLN: 200.0000 mg | Freq: Once | INTRAVENOUS | Status: DC

## 2021-06-04 NOTE — Patient Instructions (Signed)
[facility]  Discharge Instructions: °Thank you for choosing [facility] to provide your oncology and hematology care.  °If you have a lab appointment with the Cancer Center, please go directly to the Cancer Center and check in at the registration area. ° °Wear comfortable clothing and clothing appropriate for easy access to any Portacath or PICC line.  ° °We strive to give you quality time with your provider. You may need to reschedule your appointment if you arrive late (15 or more minutes).  Arriving late affects you and other patients whose appointments are after yours.  Also, if you miss three or more appointments without notifying the office, you may be dismissed from the clinic at the provider’s discretion.    °  °For prescription refill requests, have your pharmacy contact our office and allow 72 hours for refills to be completed.   ° °Today you received the following : Venofer ° ° °Iron Sucrose Injection °What is this medication? °IRON SUCROSE (EYE ern SOO krose) treats low levels of iron (iron deficiency anemia) in people with kidney disease. Iron is a mineral that plays an important role in making red blood cells, which carry oxygen from your lungs to the rest of your body. °This medicine may be used for other purposes; ask your health care provider or pharmacist if you have questions. °COMMON BRAND NAME(S): Venofer °What should I tell my care team before I take this medication? °They need to know if you have any of these conditions: °Anemia not caused by low iron levels °Heart disease °High levels of iron in the blood °Kidney disease °Liver disease °An unusual or allergic reaction to iron, other medications, foods, dyes, or preservatives °Pregnant or trying to get pregnant °Breast-feeding °How should I use this medication? °This medication is for infusion into a vein. It is given in a hospital or clinic setting. °Talk to your care team about the use of this  medication in children. While this medication may be prescribed for children as young as 2 years for selected conditions, precautions do apply. °Overdosage: If you think you have taken too much of this medicine contact a poison control center or emergency room at once. °NOTE: This medicine is only for you. Do not share this medicine with others. °What if I miss a dose? °It is important not to miss your dose. Call your care team if you are unable to keep an appointment. °What may interact with this medication? °Do not take this medication with any of the following: °Deferoxamine °Dimercaprol °Other iron products °This medication may also interact with the following: °Chloramphenicol °Deferasirox °This list may not describe all possible interactions. Give your health care provider a list of all the medicines, herbs, non-prescription drugs, or dietary supplements you use. Also tell them if you smoke, drink alcohol, or use illegal drugs. Some items may interact with your medicine. °What should I watch for while using this medication? °Visit your care team regularly. Tell your care team if your symptoms do not start to get better or if they get worse. You may need blood work done while you are taking this medication. °You may need to follow a special diet. Talk to your care team. Foods that contain iron include: whole grains/cereals, dried fruits, beans, or peas, leafy green vegetables, and organ meats (liver, kidney). °What side effects may I notice from receiving this medication? °Side effects that you should report to your care team as soon as possible: °Allergic   reactions--skin rash, itching, hives, swelling of the face, lips, tongue, or throat °Low blood pressure--dizziness, feeling faint or lightheaded, blurry vision °Shortness of breath °Side effects that usually do not require medical attention (report to your care team if they continue or are bothersome): °Flushing °Headache °Joint pain °Muscle  pain °Nausea °Pain, redness, or irritation at injection site °This list may not describe all possible side effects. Call your doctor for medical advice about side effects. You may report side effects to FDA at 1-800-FDA-1088. °Where should I keep my medication? °This medication is given in a hospital or clinic and will not be stored at home. °NOTE: This sheet is a summary. It may not cover all possible information. If you have questions about this medicine, talk to your doctor, pharmacist, or health care provider. °© 2022 Elsevier/Gold Standard (2020-08-04 00:00:00) ° °  °To help prevent nausea and vomiting after your treatment, we encourage you to take your nausea medication as directed. ° °BELOW ARE SYMPTOMS THAT SHOULD BE REPORTED IMMEDIATELY: °*FEVER GREATER THAN 100.4 F (38 °C) OR HIGHER °*CHILLS OR SWEATING °*NAUSEA AND VOMITING THAT IS NOT CONTROLLED WITH YOUR NAUSEA MEDICATION °*UNUSUAL SHORTNESS OF BREATH °*UNUSUAL BRUISING OR BLEEDING °*URINARY PROBLEMS (pain or burning when urinating, or frequent urination) °*BOWEL PROBLEMS (unusual diarrhea, constipation, pain near the anus) °TENDERNESS IN MOUTH AND THROAT WITH OR WITHOUT PRESENCE OF ULCERS (sore throat, sores in mouth, or a toothache) °UNUSUAL RASH, SWELLING OR PAIN  °UNUSUAL VAGINAL DISCHARGE OR ITCHING  ° °Items with * indicate a potential emergency and should be followed up as soon as possible or go to the Emergency Department if any problems should occur. ° °Please show the CHEMOTHERAPY ALERT CARD or IMMUNOTHERAPY ALERT CARD at check-in to the Emergency Department and triage nurse. ° °Should you have questions after your visit or need to cancel or reschedule your appointment, please contact [facility]  [phone] and follow the prompts.  Office hours are 8:00 a.m. to 4:30 p.m. Monday - Friday. Please note that voicemails left after 4:00 p.m. may not be returned until the following business day.  We are closed  weekends and major holidays. You have access to a nurse at all times for urgent questions. Please call the main number to the clinic [phone] and follow the prompts. ° °For any non-urgent questions, you may also contact your provider using MyChart. We now offer e-Visits for anyone 18 and older to request care online for non-urgent symptoms. For details visit [email or web address]. °  °Also download the MyChart app! Go to the app store, search "MyChart", open the app, select [place], and log in with your MyChart username and password. ° °Due to Covid, a mask is required upon entering the hospital/clinic. If you do not have a mask, one will be given to you upon arrival. For doctor visits, patients may have 1 support person aged 18 or older with them. For treatment visits, patients cannot have anyone with them due to current Covid guidelines and our immunocompromised population.  °

## 2021-06-07 ENCOUNTER — Other Ambulatory Visit: Payer: Self-pay | Admitting: Obstetrics and Gynecology

## 2021-06-07 ENCOUNTER — Ambulatory Visit (INDEPENDENT_AMBULATORY_CARE_PROVIDER_SITE_OTHER): Payer: 59

## 2021-06-07 ENCOUNTER — Other Ambulatory Visit: Payer: Self-pay

## 2021-06-07 ENCOUNTER — Telehealth: Payer: Self-pay | Admitting: Obstetrics and Gynecology

## 2021-06-07 DIAGNOSIS — N921 Excessive and frequent menstruation with irregular cycle: Secondary | ICD-10-CM

## 2021-06-07 DIAGNOSIS — N939 Abnormal uterine and vaginal bleeding, unspecified: Secondary | ICD-10-CM | POA: Diagnosis not present

## 2021-06-07 NOTE — Telephone Encounter (Signed)
Pt aware of Gyn u/s results. Had neg EMB 2/23  (EM=18 mm). AUB/menorrhagia stopped with aygestin. Wants endometrial ablation. Plans to transfer to [facility].  ?

## 2021-06-13 ENCOUNTER — Other Ambulatory Visit: Payer: Self-pay

## 2021-06-13 ENCOUNTER — Inpatient Hospital Stay: Payer: 59

## 2021-06-13 VITALS — BP 123/73 | HR 80 | Temp 99.2°F | Resp 18

## 2021-06-13 DIAGNOSIS — N920 Excessive and frequent menstruation with regular cycle: Secondary | ICD-10-CM | POA: Diagnosis not present

## 2021-06-13 DIAGNOSIS — Z803 Family history of malignant neoplasm of breast: Secondary | ICD-10-CM | POA: Diagnosis not present

## 2021-06-13 DIAGNOSIS — Z800 Family history of malignant neoplasm of digestive organs: Secondary | ICD-10-CM | POA: Diagnosis not present

## 2021-06-13 DIAGNOSIS — D500 Iron deficiency anemia secondary to blood loss (chronic): Secondary | ICD-10-CM | POA: Diagnosis not present

## 2021-06-13 DIAGNOSIS — Z8041 Family history of malignant neoplasm of ovary: Secondary | ICD-10-CM | POA: Diagnosis not present

## 2021-06-13 DIAGNOSIS — Z808 Family history of malignant neoplasm of other organs or systems: Secondary | ICD-10-CM | POA: Diagnosis not present

## 2021-06-13 MED ORDER — SODIUM CHLORIDE 0.9 % IV SOLN: 200.0000 mg | Freq: Once | INTRAVENOUS | Status: DC

## 2021-06-13 MED ORDER — IRON SUCROSE 20 MG/ML IV SOLN
200.0000 mg | Freq: Once | INTRAVENOUS | Status: AC
  Administered 2021-06-13: 200 mg via INTRAVENOUS
  Filled 2021-06-13: qty 10

## 2021-06-13 MED ORDER — SODIUM CHLORIDE 0.9 % IV SOLN
Freq: Once | INTRAVENOUS | Status: AC
  Filled 2021-06-13: qty 250

## 2021-06-20 ENCOUNTER — Inpatient Hospital Stay: Payer: 59

## 2021-06-20 VITALS — BP 131/79 | HR 81 | Temp 97.2°F | Resp 18

## 2021-06-20 DIAGNOSIS — Z803 Family history of malignant neoplasm of breast: Secondary | ICD-10-CM | POA: Diagnosis not present

## 2021-06-20 DIAGNOSIS — Z8041 Family history of malignant neoplasm of ovary: Secondary | ICD-10-CM | POA: Diagnosis not present

## 2021-06-20 DIAGNOSIS — D500 Iron deficiency anemia secondary to blood loss (chronic): Secondary | ICD-10-CM

## 2021-06-20 DIAGNOSIS — Z800 Family history of malignant neoplasm of digestive organs: Secondary | ICD-10-CM | POA: Diagnosis not present

## 2021-06-20 DIAGNOSIS — Z808 Family history of malignant neoplasm of other organs or systems: Secondary | ICD-10-CM | POA: Diagnosis not present

## 2021-06-20 DIAGNOSIS — N920 Excessive and frequent menstruation with regular cycle: Secondary | ICD-10-CM | POA: Diagnosis not present

## 2021-06-20 MED ORDER — SODIUM CHLORIDE 0.9 % IV SOLN: 200.0000 mg | Freq: Once | INTRAVENOUS | Status: DC

## 2021-06-20 MED ORDER — SODIUM CHLORIDE 0.9 % IV SOLN
Freq: Once | INTRAVENOUS | Status: AC
  Filled 2021-06-20: qty 250

## 2021-06-20 MED ORDER — IRON SUCROSE 20 MG/ML IV SOLN
200.0000 mg | Freq: Once | INTRAVENOUS | Status: AC
  Administered 2021-06-20: 200 mg via INTRAVENOUS
  Filled 2021-06-20: qty 10

## 2021-06-20 NOTE — Patient Instructions (Signed)
[facility]  Discharge Instructions: ?Thank you for choosing [facility] to provide your oncology and hematology care.  ?If you have a lab appointment with the Cancer Center, please go directly to the Cancer Center and check in at the registration area. ? ?Wear comfortable clothing and clothing appropriate for easy access to any Portacath or PICC line.  ? ?We strive to give you quality time with your provider. You may need to reschedule your appointment if you arrive late (15 or more minutes).  Arriving late affects you and other patients whose appointments are after yours.  Also, if you miss three or more appointments without notifying the office, you may be dismissed from the clinic at the provider?s discretion.    ?  ?For prescription refill requests, have your pharmacy contact our office and allow 72 hours for refills to be completed.   ? ?Today you received the following chemotherapy and/or immunotherapy agents VENOFER    ?  ?To help prevent nausea and vomiting after your treatment, we encourage you to take your nausea medication as directed. ? ?BELOW ARE SYMPTOMS THAT SHOULD BE REPORTED IMMEDIATELY: ?*FEVER GREATER THAN 100.4 F (38 ?C) OR HIGHER ?*CHILLS OR SWEATING ?*NAUSEA AND VOMITING THAT IS NOT CONTROLLED WITH YOUR NAUSEA MEDICATION ?*UNUSUAL SHORTNESS OF BREATH ?*UNUSUAL BRUISING OR BLEEDING ?*URINARY PROBLEMS (pain or burning when urinating, or frequent urination) ?*BOWEL PROBLEMS (unusual diarrhea, constipation, pain near the anus) ?TENDERNESS IN MOUTH AND THROAT WITH OR WITHOUT PRESENCE OF ULCERS (sore throat, sores in mouth, or a toothache) ?UNUSUAL RASH, SWELLING OR PAIN  ?UNUSUAL VAGINAL DISCHARGE OR ITCHING  ? ?Items with * indicate a potential emergency and should be followed up as soon as possible or go to the Emergency Department if any problems should occur. ? ?Please show the CHEMOTHERAPY ALERT CARD or IMMUNOTHERAPY ALERT CARD at check-in to the  Emergency Department and triage nurse. ? ?Should you have questions after your visit or need to cancel or reschedule your appointment, please contact [facility]  [phone] and follow the prompts.  Office hours are 8:00 a.m. to 4:30 p.m. Monday - Friday. Please note that voicemails left after 4:00 p.m. may not be returned until the following business day.  We are closed weekends and major holidays. You have access to a nurse at all times for urgent questions. Please call the main number to the clinic [phone] and follow the prompts. ? ?For any non-urgent questions, you may also contact your provider using MyChart. We now offer e-Visits for anyone 18 and older to request care online for non-urgent symptoms. For details visit [email or web address]. ?  ?Also download the MyChart app! Go to the app store, search "MyChart", open the app, select [place], and log in with your MyChart username and password. ? ?Due to Covid, a mask is required upon entering the hospital/clinic. If you do not have a mask, one will be given to you upon arrival. For doctor visits, patients may have 1 support person aged 18 or older with them. For treatment visits, patients cannot have anyone with them due to current Covid guidelines and our immunocompromised population.  ? ?Iron Sucrose Injection ?What is this medication? ?IRON SUCROSE (EYE ern SOO krose) treats low levels of iron (iron deficiency anemia) in people with kidney disease. Iron is a mineral that plays an important role in making red blood cells, which carry oxygen from your lungs to the rest of your body. ?This medicine may   be used for other purposes; ask your health care provider or pharmacist if you have questions. ?COMMON BRAND NAME(S): Venofer ?What should I tell my care team before I take this medication? ?They need to know if you have any of these conditions: ?Anemia not caused by low iron levels ?Heart disease ?High levels of  iron in the blood ?Kidney disease ?Liver disease ?An unusual or allergic reaction to iron, other medications, foods, dyes, or preservatives ?Pregnant or trying to get pregnant ?Breast-feeding ?How should I use this medication? ?This medication is for infusion into a vein. It is given in a hospital or clinic setting. ?Talk to your care team about the use of this medication in children. While this medication may be prescribed for children as young as 2 years for selected conditions, precautions do apply. ?Overdosage: If you think you have taken too much of this medicine contact a poison control center or emergency room at once. ?NOTE: This medicine is only for you. Do not share this medicine with others. ?What if I miss a dose? ?It is important not to miss your dose. Call your care team if you are unable to keep an appointment. ?What may interact with this medication? ?Do not take this medication with any of the following: ?Deferoxamine ?Dimercaprol ?Other iron products ?This medication may also interact with the following: ?Chloramphenicol ?Deferasirox ?This list may not describe all possible interactions. Give your health care provider a list of all the medicines, herbs, non-prescription drugs, or dietary supplements you use. Also tell them if you smoke, drink alcohol, or use illegal drugs. Some items may interact with your medicine. ?What should I watch for while using this medication? ?Visit your care team regularly. Tell your care team if your symptoms do not start to get better or if they get worse. You may need blood work done while you are taking this medication. ?You may need to follow a special diet. Talk to your care team. Foods that contain iron include: whole grains/cereals, dried fruits, beans, or peas, leafy green vegetables, and organ meats (liver, kidney). ?What side effects may I notice from receiving this medication? ?Side effects that you should report to your care team as soon as  possible: ?Allergic reactions--skin rash, itching, hives, swelling of the face, lips, tongue, or throat ?Low blood pressure--dizziness, feeling faint or lightheaded, blurry vision ?Shortness of breath ?Side effects that usually do not require medical attention (report to your care team if they continue or are bothersome): ?Flushing ?Headache ?Joint pain ?Muscle pain ?Nausea ?Pain, redness, or irritation at injection site ?This list may not describe all possible side effects. Call your doctor for medical advice about side effects. You may report side effects to FDA at 1-800-FDA-1088. ?Where should I keep my medication? ?This medication is given in a hospital or clinic and will not be stored at home. ?NOTE: This sheet is a summary. It may not cover all possible information. If you have questions about this medicine, talk to your doctor, pharmacist, or health care provider. ?? 2022 Elsevier/Gold Standard (2020-08-04 00:00:00) ? ?

## 2021-06-27 ENCOUNTER — Inpatient Hospital Stay: Payer: 59 | Attending: Oncology

## 2021-06-27 VITALS — BP 134/82 | HR 77 | Temp 97.9°F | Resp 18

## 2021-06-27 DIAGNOSIS — D500 Iron deficiency anemia secondary to blood loss (chronic): Secondary | ICD-10-CM | POA: Diagnosis present

## 2021-06-27 DIAGNOSIS — N920 Excessive and frequent menstruation with regular cycle: Secondary | ICD-10-CM | POA: Insufficient documentation

## 2021-06-27 MED ORDER — IRON SUCROSE 20 MG/ML IV SOLN
200.0000 mg | Freq: Once | INTRAVENOUS | Status: AC
  Administered 2021-06-27: 200 mg via INTRAVENOUS
  Filled 2021-06-27: qty 10

## 2021-06-27 MED ORDER — SODIUM CHLORIDE 0.9 % IV SOLN: 200.0000 mg | Freq: Once | INTRAVENOUS | Status: DC

## 2021-06-27 MED ORDER — SODIUM CHLORIDE 0.9 % IV SOLN
Freq: Once | INTRAVENOUS | Status: AC
  Filled 2021-06-27: qty 250

## 2021-06-27 NOTE — Patient Instructions (Signed)
[facility]  Discharge Instructions: ?Thank you for choosing [facility] to provide your oncology and hematology care.  ?If you have a lab appointment with the Cancer Center, please go directly to the Cancer Center and check in at the registration area. ? ?Wear comfortable clothing and clothing appropriate for easy access to any Portacath or PICC line.  ? ?We strive to give you quality time with your provider. You may need to reschedule your appointment if you arrive late (15 or more minutes).  Arriving late affects you and other patients whose appointments are after yours.  Also, if you miss three or more appointments without notifying the office, you may be dismissed from the clinic at the provider?s discretion.    ?  ?For prescription refill requests, have your pharmacy contact our office and allow 72 hours for refills to be completed.   ? ?Today you received the following chemotherapy and/or immunotherapy agents VENOFER    ?  ?To help prevent nausea and vomiting after your treatment, we encourage you to take your nausea medication as directed. ? ?BELOW ARE SYMPTOMS THAT SHOULD BE REPORTED IMMEDIATELY: ?*FEVER GREATER THAN 100.4 F (38 ?C) OR HIGHER ?*CHILLS OR SWEATING ?*NAUSEA AND VOMITING THAT IS NOT CONTROLLED WITH YOUR NAUSEA MEDICATION ?*UNUSUAL SHORTNESS OF BREATH ?*UNUSUAL BRUISING OR BLEEDING ?*URINARY PROBLEMS (pain or burning when urinating, or frequent urination) ?*BOWEL PROBLEMS (unusual diarrhea, constipation, pain near the anus) ?TENDERNESS IN MOUTH AND THROAT WITH OR WITHOUT PRESENCE OF ULCERS (sore throat, sores in mouth, or a toothache) ?UNUSUAL RASH, SWELLING OR PAIN  ?UNUSUAL VAGINAL DISCHARGE OR ITCHING  ? ?Items with * indicate a potential emergency and should be followed up as soon as possible or go to the Emergency Department if any problems should occur. ? ?Please show the CHEMOTHERAPY ALERT CARD or IMMUNOTHERAPY ALERT CARD at check-in to the  Emergency Department and triage nurse. ? ?Should you have questions after your visit or need to cancel or reschedule your appointment, please contact [facility]  [phone] and follow the prompts.  Office hours are 8:00 a.m. to 4:30 p.m. Monday - Friday. Please note that voicemails left after 4:00 p.m. may not be returned until the following business day.  We are closed weekends and major holidays. You have access to a nurse at all times for urgent questions. Please call the main number to the clinic [phone] and follow the prompts. ? ?For any non-urgent questions, you may also contact your provider using MyChart. We now offer e-Visits for anyone 18 and older to request care online for non-urgent symptoms. For details visit [email or web address]. ?  ?Also download the MyChart app! Go to the app store, search "MyChart", open the app, select [place], and log in with your MyChart username and password. ? ?Due to Covid, a mask is required upon entering the hospital/clinic. If you do not have a mask, one will be given to you upon arrival. For doctor visits, patients may have 1 support person aged 18 or older with them. For treatment visits, patients cannot have anyone with them due to current Covid guidelines and our immunocompromised population.  ? ?Iron Sucrose Injection ?What is this medication? ?IRON SUCROSE (EYE ern SOO krose) treats low levels of iron (iron deficiency anemia) in people with kidney disease. Iron is a mineral that plays an important role in making red blood cells, which carry oxygen from your lungs to the rest of your body. ?This medicine may   be used for other purposes; ask your health care provider or pharmacist if you have questions. ?COMMON BRAND NAME(S): Venofer ?What should I tell my care team before I take this medication? ?They need to know if you have any of these conditions: ?Anemia not caused by low iron levels ?Heart disease ?High levels of  iron in the blood ?Kidney disease ?Liver disease ?An unusual or allergic reaction to iron, other medications, foods, dyes, or preservatives ?Pregnant or trying to get pregnant ?Breast-feeding ?How should I use this medication? ?This medication is for infusion into a vein. It is given in a hospital or clinic setting. ?Talk to your care team about the use of this medication in children. While this medication may be prescribed for children as young as 2 years for selected conditions, precautions do apply. ?Overdosage: If you think you have taken too much of this medicine contact a poison control center or emergency room at once. ?NOTE: This medicine is only for you. Do not share this medicine with others. ?What if I miss a dose? ?It is important not to miss your dose. Call your care team if you are unable to keep an appointment. ?What may interact with this medication? ?Do not take this medication with any of the following: ?Deferoxamine ?Dimercaprol ?Other iron products ?This medication may also interact with the following: ?Chloramphenicol ?Deferasirox ?This list may not describe all possible interactions. Give your health care provider a list of all the medicines, herbs, non-prescription drugs, or dietary supplements you use. Also tell them if you smoke, drink alcohol, or use illegal drugs. Some items may interact with your medicine. ?What should I watch for while using this medication? ?Visit your care team regularly. Tell your care team if your symptoms do not start to get better or if they get worse. You may need blood work done while you are taking this medication. ?You may need to follow a special diet. Talk to your care team. Foods that contain iron include: whole grains/cereals, dried fruits, beans, or peas, leafy green vegetables, and organ meats (liver, kidney). ?What side effects may I notice from receiving this medication? ?Side effects that you should report to your care team as soon as  possible: ?Allergic reactions--skin rash, itching, hives, swelling of the face, lips, tongue, or throat ?Low blood pressure--dizziness, feeling faint or lightheaded, blurry vision ?Shortness of breath ?Side effects that usually do not require medical attention (report to your care team if they continue or are bothersome): ?Flushing ?Headache ?Joint pain ?Muscle pain ?Nausea ?Pain, redness, or irritation at injection site ?This list may not describe all possible side effects. Call your doctor for medical advice about side effects. You may report side effects to FDA at 1-800-FDA-1088. ?Where should I keep my medication? ?This medication is given in a hospital or clinic and will not be stored at home. ?NOTE: This sheet is a summary. It may not cover all possible information. If you have questions about this medicine, talk to your doctor, pharmacist, or health care provider. ?? 2022 Elsevier/Gold Standard (2020-08-04 00:00:00) ? ?

## 2021-07-03 ENCOUNTER — Other Ambulatory Visit: Payer: Self-pay | Admitting: Obstetrics and Gynecology

## 2021-07-10 ENCOUNTER — Other Ambulatory Visit: Payer: Self-pay | Admitting: Obstetrics and Gynecology

## 2021-07-10 ENCOUNTER — Ambulatory Visit
Admission: RE | Admit: 2021-07-10 | Discharge: 2021-07-10 | Disposition: A | Discharge status: Home / Self Care | Payer: 59 | Source: Ambulatory Visit | Attending: Obstetrics and Gynecology | Admitting: Obstetrics and Gynecology

## 2021-07-10 DIAGNOSIS — R928 Other abnormal and inconclusive findings on diagnostic imaging of breast: Secondary | ICD-10-CM

## 2021-07-10 DIAGNOSIS — Z1231 Encounter for screening mammogram for malignant neoplasm of breast: Secondary | ICD-10-CM | POA: Diagnosis present

## 2021-07-10 NOTE — H&P (Signed)
Preoperative History and Physical ? ?Erica Haynes is a 51 y.o. [id number] here for surgical management of Menorrhagia with irregular cycle, iron deficiency anemia requiring iron infusions and dysmenorrhea.   No significant preoperative concerns. ? ?History of Present Illness: 51 y.o. [id number] female who has been having heavy and irregular periods requiring iron infusions.  Her menses have been irregular Q1-3 months, lasting 14 days (for the past 4-5 yrs). Flow is heavy for 3 days, changing super tampon Q 1-2 hrs, no clots. Bleeding stops after 6 days total, has a day off, and then a gush occurs again, followed by 3-4 days of light spotting. Dysmenorrhea moderate.  ?Pt started bleeding 03/19/21 and has been bleeding daily since. Changing products Q30-45 min on heavy days, with large clots (largest about grapefruit size); no dysmen. Pt called me 1/23 and Rx aygestin 5 mg given (pt then went out of town). Flow has lightened to changing pad daily, but hasn't stopped and has mild dysmen now. Taking Fe supp. ?Pt had discussed ablation with Dr. Kenton Kingfisher in past but then went about 6 months without a period, so never had it done. S/p neg EMB 05/2018. Had normal GYN u/s 5/21 for AUB; EM=9.3 mm. ?Pt used to have light flow with OCPs in past but would rather not be on hormones. Had bad experience with IUD. ?  ?Endometrial biopsy 05/22/2021:  ?Benign mixed pattern endometrium with marked progestational effect  ?Stromal breakdown consistent with bleeding  ?Negative for polyp, hyperplasia and carcinoma  ?  ?Pap smear 05/22/2021: NILM, HPV negative ?  ?Ultrasound 06/07/2021:  ?Date of Service: 06/07/2021  ? ? ?Indications:Abnormal Uterine Bleeding  ?Findings:  ?The uterus is anteverted and measures 9.9 x 6.2 x 5.6 cm.  ?Echo texture is heterogenous without evidence of focal masses.  ? ?The Endometrium measures 18 mm; it is poorly delineated, with multiple  ?tiny cystic components and moderate amount of blood flow seen on color  ?doppler  imaging; this appearance is suspect for hyperplasia, polyp or  ?other neoplasm.  ? ?Right Ovary measures 2.3 x 2.5 x 1.9 cm. It is normal in appearance.  ?Left Ovary is not visualized.  ?Survey of the adnexa demonstrates no adnexal masses.  ?There is no free fluid in the cul de sac.  ? ?Impression:  ?1. Abnormal appearing endometrial lining.   ?  ?  ?Erica Haynes has had to receive iron infusions due to iron deficiency anemia due to chronic blood loss from her periods.   ? ?Proposed surgery: Hysteroscopy, dilation and curettage, endometrial ablation ? ?Past Medical History:  ?Diagnosis Date  ? 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 Attention deficit disorder   ? BRCA negative 10/2015  ? MyRisk neg; IBIS=7.3%  ? Family history of breast cancer   ? Family history of ovarian cancer   ? Generalized anxiety disorder   ? IDA (iron deficiency anemia) 06/01/2021  ? Menorrhagia with irregular cycle   ? UTI (urinary tract infection)   ? ?Past Surgical History:  ?Procedure Laterality Date  ? DILATION AND CURETTAGE OF UTERUS    ? SAB  ? TONSILLECTOMY    ? ?OB History  ?Gravida Para Term Preterm AB Living  ?$Remove'6 5 5   1 5  'zizsgNQ$ ?SAB IAB Ectopic Multiple Live Births  ?$Remove'1     1 5  'KMHMtwc$ ?  ?# Outcome Date GA Lbr Len/2nd Weight Sex Delivery Anes PTL Lv  ?6 Term           ?5 Term           ?  4 Term           ?3 Term           ?2 Term           ?1 SAB           ?Patient denies any other pertinent gynecologic issues.  ? ?No current facility-administered medications on file prior to encounter.  ? ?Current Outpatient Medications on File Prior to Encounter  ?Medication Sig Dispense Refill  ? Cholecalciferol (VITAMIN D3 PO) Take 1 tablet by mouth daily.    ? COPPER PO Take 1 tablet by mouth daily.    ? Cyanocobalamin (VITAMIN B-12 PO) Take 1 tablet by mouth daily.    ? escitalopram (LEXAPRO) 10 MG tablet Take 1 tab daily 90 tablet 3  ? Ferrous Sulfate (IRON PO) Take 1 tablet by mouth daily.    ? MAGNESIUM PO Take 1 tablet by mouth daily.     ? ?No Known Allergies ? ?Social History:   reports that Erica Haynes has never smoked. Erica Haynes has never used smokeless tobacco. Erica Haynes reports current alcohol use. Erica Haynes reports that Erica Haynes does not use drugs. ? ?Family History  ?Problem Relation Age of Onset  ? Ovarian cancer Mother 53  ?     BRCA/MyRisk neg  ? Hypertension Father   ? Breast cancer Maternal Aunt   ?     16s  ? Melanoma Maternal Aunt   ?     18s  ? Ovarian cancer Maternal Aunt   ?     71s  ? Cancer Other   ?     53s, colon cancer  ? Ovarian cancer Paternal Grandmother   ? ? ?Review of Systems: Noncontributory ? ?PHYSICAL EXAM: ?There were no vitals taken for this visit. ?CONSTITUTIONAL: Well-developed, well-nourished female in no acute distress.  ?HENT:  Normocephalic, atraumatic, External right and left ear normal. Oropharynx is clear and moist ?EYES: Conjunctivae and EOM are normal. Pupils are equal, round, and reactive to light. No scleral icterus.  ?NECK: Normal range of motion, supple, no masses ?SKIN: Skin is warm and dry. No rash noted. Not diaphoretic. No erythema. No pallor. ?[place]: Alert and oriented to person, place, and time. Normal reflexes, muscle tone coordination. No cranial nerve deficit noted. ?PSYCHIATRIC: Normal mood and affect. Normal behavior. Normal judgment and thought content. ?CARDIOVASCULAR: Normal heart rate noted, regular rhythm ?RESPIRATORY: Effort and breath sounds normal, no problems with respiration noted ?ABDOMEN: Soft, nontender, nondistended. ?PELVIC: Deferred ?MUSCULOSKELETAL: Normal range of motion. No edema and no tenderness. 2+ distal pulses. ? ?Labs: ?No results found for this or any previous visit (from the past 336 hour(s)). ? ?Imaging Studies: ?MM 3D SCREEN BREAST BILATERAL ? ?Result Date: 07/10/2021 ?CLINICAL DATA:  Screening. EXAM: DIGITAL SCREENING BILATERAL MAMMOGRAM WITH TOMOSYNTHESIS AND CAD TECHNIQUE: Bilateral screening digital craniocaudal and mediolateral oblique mammograms were obtained. Bilateral screening  digital breast tomosynthesis was performed. The images were evaluated with computer-aided detection. COMPARISON:  Previous exam(s). ACR Breast Density Category c: The breast tissue is heterogeneously dense, which may obscure small masses. FINDINGS: In the right breast, possible distortion warrants further evaluation. This possible distortion is seen within the upper RIGHT breast, MLO views only, 1/2 slice 55 and 2/2 slice 58. In the left breast, no findings suspicious for malignancy. IMPRESSION: Further evaluation is suggested for possible distortion in the right breast. RECOMMENDATION: Diagnostic mammogram and possibly ultrasound of the right breast. (Code:[id number]) The patient will be contacted regarding the findings, and additional imaging will be  scheduled. BI-RADS CATEGORY  0: Incomplete. Need additional imaging evaluation and/or prior mammograms for comparison. Electronically Signed   By: Franki Cabot M.D.   On: 07/10/2021 11:22   ? ?Assessment: ?Patient Active Problem List  ? Diagnosis  ? IDA (iron deficiency anemia)  ? Menorrhagia with irregular cycle (Primary)  ? Dysmenorrhea  ? ? ?Plan: ?Patient will undergo surgical management with the above-noted surgery.   The risks of surgery were discussed in detail with the patient including but not limited to: bleeding which may require transfusion or reoperation; infection which may require antibiotics; injury to surrounding organs which may involve bowel, bladder, ureters ; need for additional procedures including laparoscopy or laparotomy; thromboembolic phenomenon, surgical site problems and other postoperative/anesthesia complications. Likelihood of success in alleviating the patient's condition was discussed. Routine postoperative instructions will be reviewed with the patient and her family in detail after surgery.  The patient concurred with the proposed plan, giving informed written consent for the surgery.  Preoperative prophylactic antibiotics, as  indicated, and SCDs ordered on call to the OR.   ? ?Prentice Docker, MD ?07/10/2021 12:45 PM    ?

## 2021-07-12 ENCOUNTER — Other Ambulatory Visit
Admission: RE | Admit: 2021-07-12 | Discharge: 2021-07-12 | Disposition: A | Discharge status: Home / Self Care | Payer: 59 | Source: Ambulatory Visit | Attending: Obstetrics and Gynecology | Admitting: Obstetrics and Gynecology

## 2021-07-12 ENCOUNTER — Other Ambulatory Visit: Payer: Self-pay

## 2021-07-12 DIAGNOSIS — Z1152 Encounter for screening for COVID-19: Secondary | ICD-10-CM

## 2021-07-12 DIAGNOSIS — D500 Iron deficiency anemia secondary to blood loss (chronic): Secondary | ICD-10-CM

## 2021-07-12 HISTORY — DX: Pneumonia, unspecified organism: J18.9

## 2021-07-12 HISTORY — DX: Personal history of urinary calculi: Z87.442

## 2021-07-12 MED ORDER — FAMOTIDINE 20 MG PO TABS: 20.0000 mg | ORAL_TABLET | Freq: Once | ORAL | Status: DC

## 2021-07-12 NOTE — Patient Instructions (Addendum)
Your procedure is scheduled on: 07/19/21 - Thursday ?Report to the Registration Desk on the 1st floor of the [facility]. ?To find out your arrival time, please call [phone] between 1PM - 3PM on: 07/18/21 - Wednesday ? ?REMEMBER: ?Instructions that are not followed completely may result in serious medical risk, up to and including death; or upon the discretion of your surgeon and anesthesiologist your surgery may need to be rescheduled. ? ?Do not eat food after midnight the night before surgery.  ?No gum chewing, lozengers or hard candies. ? ?You may however, drink CLEAR liquids up to 2 hours before you are scheduled to arrive for your surgery. Do not drink anything within 2 hours of your scheduled arrival time. ? ?Clear liquids include: ?- water  ?- apple juice without pulp ?- gatorade (not RED colors) ?- black coffee or tea (Do NOT add milk or creamers to the coffee or tea) ?Do NOT drink anything that is not on this list. ? ?In addition, your doctor has ordered for you to drink the provided  ?Ensure Pre-Surgery Clear Carbohydrate Drink  ?Drinking this carbohydrate drink up to two hours before surgery helps to reduce insulin resistance and improve patient outcomes. Please complete drinking 2 hours prior to scheduled arrival time. ? ?TAKE THESE MEDICATIONS THE MORNING OF SURGERY WITH A SIP OF WATER: ? ?- escitalopram (LEXAPRO) 10 MG tablet ? ?One week prior to surgery: ?Stop Anti-inflammatories (NSAIDS) such as Advil, Aleve, Ibuprofen, Motrin, Naproxen, Naprosyn and Aspirin based products such as Excedrin, Goodys Powder, BC Powder. ? ?Stop ANY OVER THE COUNTER supplements until after surgery: Cholecalciferol (VITAMIN D3 PO), COPPER PO, MAGNESIUM . ? ?- May continue to take Vitamin B12 and Ferrous Sulfate until the day before surgery. ? ?You may take Tylenol if needed for pain up until the day of surgery. ? ?No Alcohol for 24 hours before or after surgery. ? ?No Smoking including e-cigarettes for 24 hours  prior to surgery.  ?No chewable tobacco products for at least 6 hours prior to surgery.  ?No nicotine patches on the day of surgery. ? ?Do not use any "recreational" drugs for at least a week prior to your surgery.  ?Please be advised that the combination of cocaine and anesthesia may have negative outcomes, up to and including death. ?If you test positive for cocaine, your surgery will be cancelled. ? ?On the morning of surgery brush your teeth with toothpaste and water, you may rinse your mouth with mouthwash if you wish. ?Do not swallow any toothpaste or mouthwash. ? ?Do not wear jewelry, make-up, hairpins, clips or nail polish. ? ?Do not wear lotions, powders, or perfumes.  ? ?Do not shave body from the neck down 48 hours prior to surgery just in case you cut yourself which could leave a site for infection.  ?Also, freshly shaved skin may become irritated if using the CHG soap. ? ?Contact lenses, hearing aids and dentures may not be worn into surgery. ? ?Do not bring valuables to the hospital. [facility] is not responsible for any missing/lost belongings or valuables.  ? ?Notify your doctor if there is any change in your medical condition (cold, fever, infection). ? ?Wear comfortable clothing (specific to your surgery type) to the hospital. ? ?After surgery, you can help prevent lung complications by doing breathing exercises.  ?Take deep breaths and cough every 1-2 hours. Your doctor may order a device called an Incentive Spirometer to help you take deep breaths. ?When coughing or sneezing, hold a  pillow firmly against your incision with both hands. This is called ?splinting.? Doing this helps protect your incision. It also decreases belly discomfort. ? ?If you are being admitted to the hospital overnight, leave your suitcase in the car. ?After surgery it may be brought to your room. ? ?If you are being discharged the day of surgery, you will not be allowed to drive home. ?You will need a responsible adult (18  years or older) to drive you home and stay with you that night.  ? ?If you are taking public transportation, you will need to have a responsible adult (18 years or older) with you. ?Please confirm with your physician that it is acceptable to use public transportation.  ? ?Please call the Pre-admissions Testing Dept. at [phone] if you have any questions about these instructions. ? ?Surgery Visitation Policy: ? ?Patients undergoing a surgery or procedure may have two family members or support persons with them as long as the person is not COVID-19 positive or experiencing its symptoms.  ? ?Inpatient Visitation:   ? ?Visiting hours are 7 a.m. to 8 p.m. ?Up to four visitors are allowed at one time in a patient room, including children. The visitors may rotate out with other people during the day. One designated support person (adult) may remain overnight.  ?

## 2021-07-13 ENCOUNTER — Other Ambulatory Visit
Admission: RE | Admit: 2021-07-13 | Discharge: 2021-07-13 | Disposition: A | Discharge status: Home / Self Care | Payer: 59 | Source: Ambulatory Visit | Attending: Obstetrics and Gynecology | Admitting: Obstetrics and Gynecology

## 2021-07-13 DIAGNOSIS — D500 Iron deficiency anemia secondary to blood loss (chronic): Secondary | ICD-10-CM | POA: Insufficient documentation

## 2021-07-13 DIAGNOSIS — Z1152 Encounter for screening for COVID-19: Secondary | ICD-10-CM

## 2021-07-13 DIAGNOSIS — Z01812 Encounter for preprocedural laboratory examination: Secondary | ICD-10-CM | POA: Insufficient documentation

## 2021-07-13 LAB — CBC
HCT: 41.1 % (ref 36.0–46.0)
Hemoglobin: 12.8 g/dL (ref 12.0–15.0)
MCH: 26.6 pg (ref 26.0–34.0)
MCHC: 31.1 g/dL (ref 30.0–36.0)
MCV: 85.4 fL (ref 80.0–100.0)
Platelets: 196 10*3/uL (ref 150–400)
RBC: 4.81 MIL/uL (ref 3.87–5.11)
RDW: 14.4 % (ref 11.5–15.5)
WBC: 5.5 10*3/uL (ref 4.0–10.5)
nRBC: 0 % (ref 0.0–0.2)

## 2021-07-13 LAB — TYPE AND SCREEN
ABO/RH(D): A NEG
Antibody Screen: NEGATIVE

## 2021-07-18 MED ORDER — LACTATED RINGERS IV SOLN: INTRAVENOUS | Status: DC

## 2021-07-18 MED ORDER — CHLORHEXIDINE GLUCONATE 0.12 % MT SOLN: 15.0000 mL | Freq: Once | OROMUCOSAL | Status: DC

## 2021-07-18 MED ORDER — ORAL CARE MOUTH RINSE: 15.0000 mL | Freq: Once | OROMUCOSAL | Status: DC

## 2021-07-19 ENCOUNTER — Encounter: Payer: Self-pay | Admitting: Obstetrics and Gynecology

## 2021-07-19 ENCOUNTER — Other Ambulatory Visit: Payer: Self-pay

## 2021-07-19 ENCOUNTER — Ambulatory Visit: Payer: 59 | Admitting: Certified Registered"

## 2021-07-19 ENCOUNTER — Ambulatory Visit
Admission: RE | Admit: 2021-07-19 | Discharge: 2021-07-19 | Disposition: A | Discharge status: Home / Self Care | Payer: 59 | Attending: Obstetrics and Gynecology | Admitting: Obstetrics and Gynecology

## 2021-07-19 ENCOUNTER — Encounter
Admission: RE | Disposition: A | Discharge status: Home / Self Care | Payer: Self-pay | Source: Home / Self Care | Attending: Obstetrics and Gynecology

## 2021-07-19 DIAGNOSIS — D509 Iron deficiency anemia, unspecified: Secondary | ICD-10-CM | POA: Diagnosis present

## 2021-07-19 DIAGNOSIS — Z79899 Other long term (current) drug therapy: Secondary | ICD-10-CM | POA: Insufficient documentation

## 2021-07-19 DIAGNOSIS — N921 Excessive and frequent menstruation with irregular cycle: Secondary | ICD-10-CM | POA: Diagnosis present

## 2021-07-19 DIAGNOSIS — F411 Generalized anxiety disorder: Secondary | ICD-10-CM | POA: Diagnosis not present

## 2021-07-19 DIAGNOSIS — N946 Dysmenorrhea, unspecified: Secondary | ICD-10-CM | POA: Diagnosis present

## 2021-07-19 DIAGNOSIS — D500 Iron deficiency anemia secondary to blood loss (chronic): Secondary | ICD-10-CM

## 2021-07-19 HISTORY — PX: DILITATION & CURRETTAGE/HYSTROSCOPY WITH NOVASURE ABLATION: SHX5568

## 2021-07-19 LAB — ABO/RH: ABO/RH(D): A NEG

## 2021-07-19 LAB — POCT PREGNANCY, URINE: Preg Test, Ur: NEGATIVE

## 2021-07-19 SURGERY — DILATATION & CURETTAGE/HYSTEROSCOPY WITH NOVASURE ABLATION
Anesthesia: General

## 2021-07-19 MED ORDER — ACETAMINOPHEN 10 MG/ML IV SOLN
INTRAVENOUS | Status: DC | PRN
  Administered 2021-07-19: 1000 mg via INTRAVENOUS

## 2021-07-19 MED ORDER — PROPOFOL 10 MG/ML IV BOLUS
INTRAVENOUS | Status: DC | PRN
  Administered 2021-07-19: 200 mg via INTRAVENOUS

## 2021-07-19 MED ORDER — DEXMEDETOMIDINE (PRECEDEX) IN NS 20 MCG/5ML (4 MCG/ML) IV SYRINGE
PREFILLED_SYRINGE | INTRAVENOUS | Status: DC | PRN
  Administered 2021-07-19: 8 ug via INTRAVENOUS

## 2021-07-19 MED ORDER — SILVER NITRATE-POT NITRATE 75-25 % EX MISC
CUTANEOUS | Status: AC
  Filled 2021-07-19: qty 10

## 2021-07-19 MED ORDER — LIDOCAINE HCL (PF) 2 % IJ SOLN
INTRAMUSCULAR | Status: AC
Start: 2021-07-19 — End: ?
  Filled 2021-07-19: qty 5

## 2021-07-19 MED ORDER — OXYCODONE HCL 5 MG/5ML PO SOLN: 5.0000 mg | Freq: Once | ORAL | Status: AC | PRN

## 2021-07-19 MED ORDER — KETOROLAC TROMETHAMINE 30 MG/ML IJ SOLN
INTRAMUSCULAR | Status: DC | PRN
  Administered 2021-07-19: 30 mg via INTRAVENOUS

## 2021-07-19 MED ORDER — OXYCODONE HCL 5 MG PO TABS
5.0000 mg | ORAL_TABLET | Freq: Once | ORAL | Status: AC | PRN
  Administered 2021-07-19: 5 mg via ORAL

## 2021-07-19 MED ORDER — LIDOCAINE HCL (CARDIAC) PF 100 MG/5ML IV SOSY
PREFILLED_SYRINGE | INTRAVENOUS | Status: DC | PRN
  Administered 2021-07-19: 80 mg via INTRAVENOUS

## 2021-07-19 MED ORDER — FENTANYL CITRATE (PF) 100 MCG/2ML IJ SOLN
INTRAMUSCULAR | Status: AC
Start: 2021-07-19 — End: ?
  Filled 2021-07-19: qty 2

## 2021-07-19 MED ORDER — ACETAMINOPHEN 10 MG/ML IV SOLN
INTRAVENOUS | Status: AC
  Filled 2021-07-19: qty 100

## 2021-07-19 MED ORDER — CHLORHEXIDINE GLUCONATE 0.12 % MT SOLN
OROMUCOSAL | Status: AC
  Filled 2021-07-19: qty 15

## 2021-07-19 MED ORDER — SILVER NITRATE-POT NITRATE 75-25 % EX MISC
CUTANEOUS | Status: DC | PRN
  Administered 2021-07-19: 1

## 2021-07-19 MED ORDER — FENTANYL CITRATE (PF) 100 MCG/2ML IJ SOLN: 25.0000 ug | INTRAMUSCULAR | Status: DC | PRN

## 2021-07-19 MED ORDER — DEXAMETHASONE SODIUM PHOSPHATE 10 MG/ML IJ SOLN
INTRAMUSCULAR | Status: AC
  Filled 2021-07-19: qty 1

## 2021-07-19 MED ORDER — DEXAMETHASONE SODIUM PHOSPHATE 10 MG/ML IJ SOLN
INTRAMUSCULAR | Status: DC | PRN
  Administered 2021-07-19: 10 mg via INTRAVENOUS

## 2021-07-19 MED ORDER — METOPROLOL TARTRATE 5 MG/5ML IV SOLN
INTRAVENOUS | Status: AC
  Filled 2021-07-19: qty 5

## 2021-07-19 MED ORDER — IBUPROFEN 600 MG PO TABS: 600.0000 mg | ORAL_TABLET | Freq: Four times a day (QID) | ORAL | 0 refills | Status: AC

## 2021-07-19 MED ORDER — ONDANSETRON HCL 4 MG/2ML IJ SOLN
INTRAMUSCULAR | Status: AC
  Filled 2021-07-19: qty 2

## 2021-07-19 MED ORDER — MIDAZOLAM HCL 2 MG/2ML IJ SOLN
INTRAMUSCULAR | Status: AC
  Filled 2021-07-19: qty 2

## 2021-07-19 MED ORDER — SEVOFLURANE IN SOLN
RESPIRATORY_TRACT | Status: AC
  Filled 2021-07-19: qty 250

## 2021-07-19 MED ORDER — KETOROLAC TROMETHAMINE 30 MG/ML IJ SOLN
INTRAMUSCULAR | Status: AC
  Filled 2021-07-19: qty 1

## 2021-07-19 MED ORDER — PROPOFOL 10 MG/ML IV BOLUS
INTRAVENOUS | Status: AC
  Filled 2021-07-19: qty 20

## 2021-07-19 MED ORDER — MIDAZOLAM HCL 2 MG/2ML IJ SOLN
INTRAMUSCULAR | Status: DC | PRN
  Administered 2021-07-19: 2 mg via INTRAVENOUS

## 2021-07-19 MED ORDER — FENTANYL CITRATE (PF) 100 MCG/2ML IJ SOLN
INTRAMUSCULAR | Status: DC | PRN
  Administered 2021-07-19 (×2): 25 ug via INTRAVENOUS

## 2021-07-19 MED ORDER — OXYCODONE HCL 5 MG PO TABS
ORAL_TABLET | ORAL | Status: AC
  Filled 2021-07-19: qty 1

## 2021-07-19 SURGICAL SUPPLY — 30 items
ABLATOR SURESOUND NOVASURE (ABLATOR) ×1 IMPLANT
BACTOSHIELD CHG 4% 4OZ (MISCELLANEOUS) ×1
BAG DRN RND TRDRP ANRFLXCHMBR (UROLOGICAL SUPPLIES)
BAG URINE DRAIN 2000ML AR STRL (UROLOGICAL SUPPLIES) IMPLANT
CATH FOLEY 2WAY  5CC 16FR (CATHETERS)
CATH FOLEY 2WAY 5CC 16FR (CATHETERS)
CATH URTH 16FR FL 2W BLN LF (CATHETERS) IMPLANT
DEVICE MYOSURE LITE (MISCELLANEOUS) ×1 IMPLANT
DEVICE MYOSURE REACH (MISCELLANEOUS) ×1 IMPLANT
DRSG TELFA 3X8 NADH (GAUZE/BANDAGES/DRESSINGS) IMPLANT
ELECT REM PT RETURN 9FT ADLT (ELECTROSURGICAL) ×2
ELECTRODE REM PT RTRN 9FT ADLT (ELECTROSURGICAL) ×1 IMPLANT
GLOVE BIO SURGEON STRL SZ7 (GLOVE) ×2 IMPLANT
GLOVE SURG UNDER POLY LF SZ7.5 (GLOVE) ×2 IMPLANT
GOWN STRL REUS W/ TWL LRG LVL3 (GOWN DISPOSABLE) ×2 IMPLANT
GOWN STRL REUS W/TWL LRG LVL3 (GOWN DISPOSABLE) ×4
IV NS IRRIG 3000ML ARTHROMATIC (IV SOLUTION) ×2 IMPLANT
KIT PROCEDURE FLUENT (KITS) ×2 IMPLANT
KIT TURNOVER CYSTO (KITS) ×2 IMPLANT
MANIFOLD NEPTUNE II (INSTRUMENTS) ×2 IMPLANT
PACK DNC HYST (MISCELLANEOUS) ×2 IMPLANT
PAD DRESSING TELFA 3X8 NADH (GAUZE/BANDAGES/DRESSINGS) IMPLANT
PAD OB MATERNITY 4.3X12.25 (PERSONAL CARE ITEMS) ×2 IMPLANT
PAD PREP 24X41 OB/GYN DISP (PERSONAL CARE ITEMS) ×2 IMPLANT
SCRUB CHG 4% DYNA-HEX 4OZ (MISCELLANEOUS) ×1 IMPLANT
SEAL ROD LENS SCOPE MYOSURE (ABLATOR) ×2 IMPLANT
SET CYSTO W/LG BORE CLAMP LF (SET/KITS/TRAYS/PACK) IMPLANT
SURGILUBE 2OZ TUBE FLIPTOP (MISCELLANEOUS) ×2 IMPLANT
TUBING CONNECTING 10 (TUBING) ×2 IMPLANT
WATER STERILE IRR 500ML POUR (IV SOLUTION) ×2 IMPLANT

## 2021-07-19 NOTE — Transfer of Care (Signed)
Immediate Anesthesia Transfer of Care Note ? ?Patient: Erica Haynes ? ?Procedure(s) Performed: DILATATION & CURETTAGE/HYSTEROSCOPY WITH NOVASURE ABLATION ? ?Patient Location: PACU ? ?Anesthesia Type:General ? ?Level of Consciousness: drowsy and patient cooperative ? ?Airway & Oxygen Therapy: Patient Spontanous Breathing and Patient connected to face mask oxygen ? ?Post-op Assessment: Report given to RN and Post -op Vital signs reviewed and stable ? ?Post vital signs: Reviewed and stable ? ?Last Vitals:  ?Vitals Value Taken Time  ?BP 92/59 07/19/21 1210  ?Temp    ?Pulse 66 07/19/21 1212  ?Resp 10 07/19/21 1212  ?SpO2 99 % 07/19/21 1212  ?Vitals shown include unvalidated device data. ? ?Last Pain:  ?Vitals:  ? 07/19/21 0957  ?TempSrc: Temporal  ?PainSc: 0-No pain  ?   ? ?  ? ?Complications: No notable events documented. ?

## 2021-07-19 NOTE — Anesthesia Postprocedure Evaluation (Signed)
Anesthesia Post Note ? ?Patient: Erica Haynes ? ?Procedure(s) Performed: DILATATION & CURETTAGE/HYSTEROSCOPY WITH NOVASURE ABLATION ? ?Patient location during evaluation: PACU ?Anesthesia Type: General ?Level of consciousness: awake and alert ?Pain management: pain level controlled ?Vital Signs Assessment: post-procedure vital signs reviewed and stable ?Respiratory status: spontaneous breathing, nonlabored ventilation, respiratory function stable and patient connected to nasal cannula oxygen ?Cardiovascular status: blood pressure returned to baseline and stable ?Postop Assessment: no apparent nausea or vomiting ?Anesthetic complications: no ? ? ?No notable events documented. ? ? ?Last Vitals:  ?Vitals:  ? 07/19/21 1230 07/19/21 1250  ?BP:  106/75  ?Pulse: 75 74  ?Resp: 12 15  ?Temp: 36.7 ?C 36.8 ?C  ?SpO2: 97% 99%  ?  ?Last Pain:  ?Vitals:  ? 07/19/21 1250  ?TempSrc: Temporal  ?PainSc: 1   ? ? ?  ?  ?  ?  ?  ?  ? ?Cleda Mccreedy Kamorah Nevils ? ? ? ? ?

## 2021-07-19 NOTE — Anesthesia Procedure Notes (Signed)
Procedure Name: LMA Insertion ?Date/Time: 07/19/2021 11:23 AM ?Performed by: Elmarie Mainland, CRNA ?Pre-anesthesia Checklist: Emergency Drugs available, Suction available, Patient identified and Patient being monitored ?Patient Re-evaluated:Patient Re-evaluated prior to induction ?Oxygen Delivery Method: Circle system utilized ?Preoxygenation: Pre-oxygenation with 100% oxygen ?Induction Type: IV induction ?Ventilation: Mask ventilation without difficulty ?LMA: LMA inserted ?LMA Size: 4.5 ?Number of attempts: 1 ?Placement Confirmation: positive ETCO2 and breath sounds checked- equal and bilateral ?Tube secured with: Tape ?Dental Injury: Teeth and Oropharynx as per pre-operative assessment  ? ? ? ? ?

## 2021-07-19 NOTE — Op Note (Signed)
Operative Note   ? ?Name: Erica Haynes  ?Date of Birth: 29-Aug-1970  ?Date of Service: 07/19/2021  ?MRN: [id number]  ? ?PRE-OP DIAGNOSIS:  ?1) Menorrhagia with irregular cycle [N92.1] ?2) Dysmenorrhea [N94.6] ?3) Iron Deficiency anemia [D50.9] ? ?POST-OP DIAGNOSIS:  ?1) Menorrhagia with irregular cycle [N92.1] ?2) Dysmenorrhea [N94.6] ?3) Iron Deficiency anemia [D50.9] ? ?SURGEON: Conard Novak, MD ? ?PROCEDURE: DILATATION & CURETTAGE/HYSTEROSCOPY WITH NOVASURE ABLATION ? ?ANESTHESIA: General  ? ?ESTIMATED BLOOD LOSS: minimal  ? ?IV Fluid: 600 mL crystalloid ? ?SPECIMENS: endometrial curettings ? ?FLUID DEFICIT: 50 mL ? ?COMPLICATIONS: none  ? ?DISPOSITION: PACU - hemodynamically stable.  ? ?CONDITION: stable  ? ?FINDINGS: Exam under anesthesia revealed small, mobile uterus with no masses and bilateral adnexa without masses or fullness. Hysteroscopy revealed grossly normal appearing uterine cavity with bilateral tubal ostia and normal appearing endocervical canal. Findings after ablation revealed globally ablated endometrium. ? ?PROCEDURE IN DETAIL: After informed consent was obtained, the patient was taken to the operating room where anesthesia was obtained without difficulty. The patient was positioned in the dorsal lithotomy position in Arrington stirrups. The patient's bladder was catheterized with an in and out foley catheter. The patient was examined under anesthesia, with the above noted findings. The bivalved speculum was placed inside the patient's vagina, and the the anterior lip of the cervix was seen and grasped with the tenaculum. The cervix was progressively dilated to a 11mm using Hegar dilators. The MyoSure hysteroscope was introduced, with LR fluid used to distend the intrauterine cavity, with the above noted findings.  ? ?The hystersocope was removed and the uterine cavity was curetted until a gritty texture was noted, yielding endometrial curettings.  ?  ?The NovaSure device was then placed without  difficulty. Measurements were obtained. Patient was noted to have a cavity length of 5.5 cm and a uterine width of 4.7 cm. The NovaSure device was first performed the cavity assessment and after confirmation the procedures performed. Length of procedure was 66 seconds. The NovaSure device is then removed and repeat hysteroscopy reveals an appropriate lining of the uterus and no perforation or injury. Hysteroscope is removed with minimal discrepancy of fluid. ?  ?Tenaculum was removed with excellent hemostasis noted after application of silver nitrate to the tenaculum entry sites. She was then taken out of dorsal lithotomy. Hemostasis noted. ? ?The patient tolerated the procedure well. Sponge, lap and needle counts were correct x2. The patient was taken to recovery room in excellent condition. ? ?Thomasene Mohair, MD ?07/19/2021 12:04 PM  ? ?

## 2021-07-19 NOTE — Discharge Instructions (Signed)

## 2021-07-19 NOTE — Anesthesia Preprocedure Evaluation (Signed)
Anesthesia Evaluation  ?Patient identified by MRN, date of birth, ID band ?Patient awake ? ? ? ?Reviewed: ?Allergy & Precautions, NPO status , Patient's Chart, lab work & pertinent test results ? ?History of Anesthesia Complications ?Negative for: history of anesthetic complications ? ?Airway ?Mallampati: III ? ?TM Distance: >3 FB ?Neck ROM: full ? ? ? Dental ? ?(+) Chipped ?  ?Pulmonary ?neg shortness of breath,  ?  ?Pulmonary exam normal ? ? ? ? ? ? ? Cardiovascular ?(-) anginanegative cardio ROS ?Normal cardiovascular exam ? ? ?  ?Neuro/Psych ?PSYCHIATRIC DISORDERS negative neurological ROS ? negative psych ROS  ? GI/Hepatic ?negative GI ROS, Neg liver ROS, neg GERD  ,  ?Endo/Other  ?negative endocrine ROS ? Renal/GU ?  ? ?  ?Musculoskeletal ? ?(+) Arthritis ,  ? Abdominal ?  ?Peds ? Hematology ?negative hematology ROS ?(+)   ?Anesthesia Other Findings ?Past Medical History: ?05/25/2018: Anterior subluxation of sternoclavicular joint ?No date: Attention deficit disorder ?10/2015: BRCA negative ?    Comment:  MyRisk neg; IBIS=7.3% ?No date: Family history of breast cancer ?No date: Family history of ovarian cancer ?No date: Generalized anxiety disorder ?No date: History of kidney stones ?06/01/2021: IDA (iron deficiency anemia) ?No date: Menorrhagia with irregular cycle ?No date: Pneumonia ?No date: UTI (urinary tract infection) ? ?Past Surgical History: ?No date: DILATION AND CURETTAGE OF UTERUS ?    Comment:  SAB ?No date: TONSILLECTOMY ?No date: WISDOM TOOTH EXTRACTION ? ? ? ? Reproductive/Obstetrics ?negative OB ROS ? ?  ? ? ? ? ? ? ? ? ? ? ? ? ? ?  ?  ? ? ? ? ? ? ? ? ?Anesthesia Physical ?Anesthesia Plan ? ?ASA: 2 ? ?Anesthesia Plan: General LMA  ? ?Post-op Pain Management:   ? ?Induction: Intravenous ? ?PONV Risk Score and Plan: Dexamethasone, Ondansetron, Midazolam and Treatment may vary due to age or medical condition ? ?Airway Management Planned: LMA ? ?Additional  Equipment:  ? ?Intra-op Plan:  ? ?Post-operative Plan: Extubation in OR ? ?Informed Consent: I have reviewed the patients History and Physical, chart, labs and discussed the procedure including the risks, benefits and alternatives for the proposed anesthesia with the patient or authorized representative who has indicated his/her understanding and acceptance.  ? ? ? ?Dental Advisory Given ? ?Plan Discussed with: Anesthesiologist, CRNA and Surgeon ? ?Anesthesia Plan Comments: (Patient consented for risks of anesthesia including but not limited to:  ?- adverse reactions to medications ?- damage to eyes, teeth, lips or other oral mucosa ?- nerve damage due to positioning  ?- sore throat or hoarseness ?- Damage to heart, brain, nerves, lungs, other parts of body or loss of life ? ?Patient voiced understanding.)  ? ? ? ? ? ? ?Anesthesia Quick Evaluation ? ?

## 2021-07-19 NOTE — Interval H&P Note (Signed)
History and Physical Interval Note: ? ?07/19/2021 ?11:12 AM ? ?[place]  has presented today for surgery, with the diagnosis of Menorrhagia with irregular cycle, iron deficiency anemia due to chronic blood loss.  The various methods of treatment have been discussed with the patient and family. After consideration of risks, benefits and other options for treatment, the patient has consented to  Procedure(s): ?DILATATION & CURETTAGE/HYSTEROSCOPY WITH NOVASURE ABLATION (N/A) as a surgical intervention.  The patient's history has been reviewed, patient examined, no change in status, stable for surgery.  I have reviewed the patient's chart and labs.  Questions were answered to the patient's satisfaction.  Consent reviewed. Patient agrees to proceed. ? ?Prentice Docker, MD, FACOG ?[facility] ?07/19/2021 11:13 AM   ? ?

## 2021-07-20 ENCOUNTER — Encounter: Payer: Self-pay | Admitting: Obstetrics and Gynecology

## 2021-07-20 LAB — SURGICAL PATHOLOGY

## 2021-07-26 ENCOUNTER — Ambulatory Visit
Admission: RE | Admit: 2021-07-26 | Discharge: 2021-07-26 | Disposition: A | Discharge status: Home / Self Care | Payer: 59 | Source: Ambulatory Visit | Attending: Obstetrics and Gynecology | Admitting: Obstetrics and Gynecology

## 2021-07-26 DIAGNOSIS — R928 Other abnormal and inconclusive findings on diagnostic imaging of breast: Secondary | ICD-10-CM

## 2021-07-27 ENCOUNTER — Encounter: Payer: Self-pay | Admitting: Obstetrics and Gynecology

## 2021-09-03 ENCOUNTER — Inpatient Hospital Stay: Payer: 59 | Attending: Oncology

## 2021-09-03 ENCOUNTER — Other Ambulatory Visit: Payer: Self-pay

## 2021-09-03 DIAGNOSIS — D500 Iron deficiency anemia secondary to blood loss (chronic): Secondary | ICD-10-CM | POA: Diagnosis present

## 2021-09-03 DIAGNOSIS — N921 Excessive and frequent menstruation with irregular cycle: Secondary | ICD-10-CM | POA: Insufficient documentation

## 2021-09-03 DIAGNOSIS — Z8041 Family history of malignant neoplasm of ovary: Secondary | ICD-10-CM | POA: Insufficient documentation

## 2021-09-03 DIAGNOSIS — Z79899 Other long term (current) drug therapy: Secondary | ICD-10-CM | POA: Insufficient documentation

## 2021-09-03 DIAGNOSIS — Z800 Family history of malignant neoplasm of digestive organs: Secondary | ICD-10-CM | POA: Diagnosis not present

## 2021-09-03 DIAGNOSIS — Z803 Family history of malignant neoplasm of breast: Secondary | ICD-10-CM | POA: Insufficient documentation

## 2021-09-03 LAB — CBC WITH DIFFERENTIAL/PLATELET
Abs Immature Granulocytes: 0.01 10*3/uL (ref 0.00–0.07)
Basophils Absolute: 0 10*3/uL (ref 0.0–0.1)
Basophils Relative: 1 %
Eosinophils Absolute: 0.2 10*3/uL (ref 0.0–0.5)
Eosinophils Relative: 3 %
HCT: 43.8 % (ref 36.0–46.0)
Hemoglobin: 13.9 g/dL (ref 12.0–15.0)
Immature Granulocytes: 0 %
Lymphocytes Relative: 27 %
Lymphs Abs: 1.7 10*3/uL (ref 0.7–4.0)
MCH: 26.5 pg (ref 26.0–34.0)
MCHC: 31.7 g/dL (ref 30.0–36.0)
MCV: 83.4 fL (ref 80.0–100.0)
Monocytes Absolute: 0.5 10*3/uL (ref 0.1–1.0)
Monocytes Relative: 8 %
Neutro Abs: 3.7 10*3/uL (ref 1.7–7.7)
Neutrophils Relative %: 61 %
Platelets: 196 10*3/uL (ref 150–400)
RBC: 5.25 MIL/uL — ABNORMAL HIGH (ref 3.87–5.11)
RDW: 15.7 % — ABNORMAL HIGH (ref 11.5–15.5)
WBC: 6.1 10*3/uL (ref 4.0–10.5)
nRBC: 0 % (ref 0.0–0.2)

## 2021-09-03 LAB — IRON AND TIBC
Iron: 44 ug/dL (ref 28–170)
Saturation Ratios: 12 % (ref 10.4–31.8)
TIBC: 370 ug/dL (ref 250–450)
UIBC: 326 ug/dL

## 2021-09-03 LAB — FERRITIN: Ferritin: 30 ng/mL (ref 11–307)

## 2021-09-04 ENCOUNTER — Inpatient Hospital Stay (HOSPITAL_BASED_OUTPATIENT_CLINIC_OR_DEPARTMENT_OTHER): Payer: 59 | Admitting: Oncology

## 2021-09-04 ENCOUNTER — Inpatient Hospital Stay: Payer: 59

## 2021-09-04 ENCOUNTER — Encounter: Payer: Self-pay | Admitting: Oncology

## 2021-09-04 VITALS — BP 118/84 | HR 69 | Temp 96.9°F | Wt 189.0 lb

## 2021-09-04 DIAGNOSIS — D500 Iron deficiency anemia secondary to blood loss (chronic): Secondary | ICD-10-CM

## 2021-09-04 DIAGNOSIS — N921 Excessive and frequent menstruation with irregular cycle: Secondary | ICD-10-CM | POA: Diagnosis not present

## 2021-09-04 DIAGNOSIS — N920 Excessive and frequent menstruation with regular cycle: Secondary | ICD-10-CM

## 2021-09-04 NOTE — Assessment & Plan Note (Addendum)
Due to chronic blood loss. Status post IV Venofer treatments.  Labs reviewed and discussed with patient. Hold off IV venofer. Borderline iron saturation.  Recommend maintenance oral iron supplementation- ferrous sulfate once daily and iron enriched food Recommend colonoscopy screening. She is overdue. Discussed with her.  Follow up with pcp, recommend iron labs to be checked in 6 months, if normal, she may come off iron supplementation and have iron labs annually.

## 2021-09-04 NOTE — Assessment & Plan Note (Addendum)
Improved after D&C.  follow-up with gynecology.

## 2021-09-04 NOTE — Progress Notes (Signed)
Hematology/Oncology Consult note [phone] [phone]         Patient Care Team: Rusty Aus, MD as PCP - General (Internal Medicine)  REFERRING PROVIDER: Rusty Aus, MD   ASSESSMENT & PLAN:  IDA (iron deficiency anemia) Due to chronic blood loss. Status post IV Venofer treatments.  Labs reviewed and discussed with patient. Hold off IV venofer. Borderline iron saturation.  Recommend maintenance oral iron supplementation- ferrous sulfate once daily and iron enriched food Recommend colonoscopy screening. She is overdue. Discussed with her.  Follow up with pcp, recommend iron labs to be checked in 6 months, if normal, she may come off iron supplementation and have iron labs annually.    Menorrhagia with irregular cycle Improved after D&C.  follow-up with gynecology.  No orders of the defined types were placed in this encounter.  Discharge.  All questions were answered. The patient knows to call the clinic with any problems, questions or concerns.  Earlie Server, MD, PhD [facility] Hematology Oncology 09/04/2021    CHIEF COMPLAINTS/REASON FOR VISIT:  iron deficiency anemia.   HISTORY OF PRESENTING ILLNESS:   Erica Haynes is a  51 y.o.  female with PMH listed below was seen in consultation at the request of  Rusty Aus, MD  for evaluation of iron deficiency anemia.   She has heavy menstrual period. + fatigue, SOB, decreased energy level. She has had some GI issue, with diarrhea. Denies melena, blood in the stool. She had blood work done on 2/28/203. Cbc showed hemoglobin 8.9 mcv 92. Iron panel showed iron saturation 4, ferritin 11  05/22/2021 Endometrium biopsy showed Benign mixed pattern endometrium with marked progestational effect Stromal breakdown consistent with bleeding Negative for polyp, hyperplasia and carcinoma. Cytology showed - Negative for intraepithelial lesion or malignancy (NILM)  Reviewed her previous records, anemia onset  is new. Iron deficiency showed decreased ferritin of 5 on 01/25/20. She has taken oral iron supplementation -Optiferrin  INTERVAL HISTORY Erica Haynes is a 51 y.o. female who has above history reviewed by me today presents for follow up visit for IDA.  S/p IV Venofer and tolerated well.  Fatigue has improved.  S/p D&c   Review of Systems  Constitutional:  Negative for appetite change, chills, fatigue and fever.  HENT:   Negative for hearing loss and voice change.   Eyes:  Negative for eye problems.  Respiratory:  Negative for chest tightness, cough and shortness of breath.   Cardiovascular:  Negative for chest pain.  Gastrointestinal:  Negative for abdominal distention, abdominal pain and blood in stool.  Endocrine: Negative for hot flashes.  Genitourinary:  Negative for difficulty urinating, frequency and menstrual problem.   Musculoskeletal:  Negative for arthralgias.  Skin:  Negative for itching and rash.  Neurological:  Negative for extremity weakness.  Hematological:  Negative for adenopathy.  Psychiatric/Behavioral:  Negative for confusion.     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   Attention deficit disorder    BRCA negative 10/2015   MyRisk neg; IBIS=7.3%   Family history of breast cancer    Family history of ovarian cancer    Generalized anxiety disorder    History of kidney stones    IDA (iron deficiency anemia) 06/01/2021   Menorrhagia with irregular cycle    Pneumonia    UTI (urinary tract infection)     SURGICAL HISTORY: Past Surgical History:  Procedure Laterality Date   DILATION AND  CURETTAGE OF UTERUS     SAB   DILITATION & CURRETTAGE/HYSTROSCOPY WITH NOVASURE ABLATION N/A 07/19/2021   Procedure: DILATATION & CURETTAGE/HYSTEROSCOPY WITH NOVASURE ABLATION;  Surgeon: Will Bonnet, MD;  Location: [facility] ORS;  Service: Gynecology;  Laterality: N/A;   TONSILLECTOMY      WISDOM TOOTH EXTRACTION      SOCIAL HISTORY: Social History   Socioeconomic History   Marital status: Married    Spouse name: Jeneen Rinks   Number of children: 3   Years of education: Not on file   Highest education level: Not on file  Occupational History   Not on file  Tobacco Use   Smoking status: Never   Smokeless tobacco: Never  Vaping Use   Vaping Use: Never used  Substance and Sexual Activity   Alcohol use: Yes    Comment: rarely   Drug use: Never   Sexual activity: Yes    Birth control/protection: Surgical    Comment: Vasectomy  Other Topics Concern   Not on file  Social History Narrative   Not on file   Social Determinants of Health   Financial Resource Strain: Not on file  Food Insecurity: Not on file  Transportation Needs: Not on file  Physical Activity: Not on file  Stress: Not on file  Social Connections: Not on file  Intimate Partner Violence: Not on file    FAMILY HISTORY: Family History  Problem Relation Age of Onset   Ovarian cancer Mother 91       BRCA/MyRisk neg   Hypertension Father    Breast cancer Maternal Aunt        60s   Melanoma Maternal Aunt        57s   Ovarian cancer Maternal Aunt        32s   Cancer Other        31s, colon cancer   Ovarian cancer Paternal Grandmother     ALLERGIES:  has No Known Allergies.  MEDICATIONS:  Current Outpatient Medications  Medication Sig Dispense Refill   Cholecalciferol (VITAMIN D3 PO) Take 1 tablet by mouth daily.     COPPER PO Take 1 tablet by mouth daily.     Cyanocobalamin (VITAMIN B-12 PO) Take 1 tablet by mouth daily.     escitalopram (LEXAPRO) 10 MG tablet Take 1 tab daily 90 tablet 3   Ferrous Sulfate (IRON PO) Take 1 tablet by mouth daily.     ibuprofen (ADVIL) 600 MG tablet Take 1 tablet (600 mg total) by mouth every 6 (six) hours. 30 tablet 0   MAGNESIUM PO Take 1 tablet by mouth daily.     No current facility-administered medications for this visit.     PHYSICAL  EXAMINATION: There were no vitals filed for this visit.  There were no vitals filed for this visit.   Physical Exam Constitutional:      General: She is not in acute distress. HENT:     Head: Normocephalic and atraumatic.  Eyes:     General: No scleral icterus. Cardiovascular:     Rate and Rhythm: Normal rate and regular rhythm.     Heart sounds: Normal heart sounds.  Pulmonary:     Effort: Pulmonary effort is normal. No respiratory distress.     Breath sounds: No wheezing.  Abdominal:     General: Bowel sounds are normal. There is no distension.     Palpations: Abdomen is soft.  Musculoskeletal:        General: No deformity. Normal range  of motion.     Cervical back: Normal range of motion and neck supple.  Skin:    General: Skin is warm and dry.     Findings: No erythema or rash.  Neurological:     Mental Status: She is alert and oriented to person, place, and time. Mental status is at baseline.     Cranial Nerves: No cranial nerve deficit.     Coordination: Coordination normal.  Psychiatric:        Mood and Affect: Mood normal.     LABORATORY DATA:  I have reviewed the data as listed Lab Results  Component Value Date   WBC 6.1 09/03/2021   HGB 13.9 09/03/2021   HCT 43.8 09/03/2021   MCV 83.4 09/03/2021   PLT 196 09/03/2021   No results for input(s): "NA", "K", "CL", "CO2", "GLUCOSE", "BUN", "CREATININE", "CALCIUM", "GFRNONAA", "GFRAA", "PROT", "ALBUMIN", "AST", "ALT", "ALKPHOS", "BILITOT", "BILIDIR", "IBILI" in the last 8760 hours. Iron/TIBC/Ferritin/ %Sat    Component Value Date/Time   IRON 44 09/03/2021 1109   IRON 16 (L) 05/31/2021 1429   TIBC 370 09/03/2021 1109   TIBC 369 05/31/2021 1429   FERRITIN 30 09/03/2021 1109   FERRITIN 11 (L) 05/31/2021 1429   IRONPCTSAT 12 09/03/2021 1109   IRONPCTSAT 4 (LL) 05/31/2021 1429      RADIOGRAPHIC STUDIES: I have personally reviewed the radiological images as listed and agreed with the findings in the  report. No results found.

## 2021-11-27 ENCOUNTER — Ambulatory Visit: Payer: Self-pay | Admitting: Physician Assistant

## 2022-08-02 ENCOUNTER — Other Ambulatory Visit: Payer: Self-pay | Admitting: Obstetrics and Gynecology

## 2022-08-02 DIAGNOSIS — Z1231 Encounter for screening mammogram for malignant neoplasm of breast: Secondary | ICD-10-CM

## 2022-08-14 ENCOUNTER — Encounter: Payer: Self-pay | Admitting: *Deleted

## 2023-10-01 ENCOUNTER — Other Ambulatory Visit: Payer: Self-pay | Admitting: Obstetrics and Gynecology

## 2023-10-01 DIAGNOSIS — Z1231 Encounter for screening mammogram for malignant neoplasm of breast: Secondary | ICD-10-CM
